# Patient Record
Sex: Female | Born: 1980 | Race: White | Hispanic: No | Marital: Married | Smoking: Never smoker
Health system: Southern US, Community
[De-identification: ages and names within clinical notes are randomized; demographics above are authoritative.]

## PROBLEM LIST (undated history)

## (undated) ENCOUNTER — Inpatient Hospital Stay (HOSPITAL_COMMUNITY): Payer: Self-pay

## (undated) DIAGNOSIS — T7840XA Allergy, unspecified, initial encounter: Secondary | ICD-10-CM

## (undated) DIAGNOSIS — G43909 Migraine, unspecified, not intractable, without status migrainosus: Secondary | ICD-10-CM

## (undated) DIAGNOSIS — F419 Anxiety disorder, unspecified: Secondary | ICD-10-CM

## (undated) DIAGNOSIS — G5601 Carpal tunnel syndrome, right upper limb: Secondary | ICD-10-CM

## (undated) DIAGNOSIS — B019 Varicella without complication: Secondary | ICD-10-CM

## (undated) DIAGNOSIS — F32A Depression, unspecified: Secondary | ICD-10-CM

## (undated) DIAGNOSIS — K900 Celiac disease: Secondary | ICD-10-CM

## (undated) DIAGNOSIS — F988 Other specified behavioral and emotional disorders with onset usually occurring in childhood and adolescence: Secondary | ICD-10-CM

## (undated) DIAGNOSIS — K219 Gastro-esophageal reflux disease without esophagitis: Secondary | ICD-10-CM

## (undated) DIAGNOSIS — F329 Major depressive disorder, single episode, unspecified: Secondary | ICD-10-CM

## (undated) DIAGNOSIS — L680 Hirsutism: Secondary | ICD-10-CM

## (undated) DIAGNOSIS — D649 Anemia, unspecified: Secondary | ICD-10-CM

## (undated) HISTORY — PX: UPPER GASTROINTESTINAL ENDOSCOPY: SHX188

## (undated) HISTORY — DX: Hirsutism: L68.0

## (undated) HISTORY — DX: Other specified behavioral and emotional disorders with onset usually occurring in childhood and adolescence: F98.8

## (undated) HISTORY — DX: Anxiety disorder, unspecified: F41.9

## (undated) HISTORY — DX: Allergy, unspecified, initial encounter: T78.40XA

## (undated) HISTORY — DX: Gastro-esophageal reflux disease without esophagitis: K21.9

## (undated) HISTORY — PX: TONSILLECTOMY: SUR1361

## (undated) HISTORY — PX: COLONOSCOPY: SHX174

## (undated) HISTORY — DX: Depression, unspecified: F32.A

## (undated) HISTORY — DX: Anemia, unspecified: D64.9

## (undated) HISTORY — DX: Migraine, unspecified, not intractable, without status migrainosus: G43.909

## (undated) HISTORY — DX: Celiac disease: K90.0

## (undated) HISTORY — DX: Varicella without complication: B01.9

## (undated) HISTORY — DX: Carpal tunnel syndrome, right upper limb: G56.01

## (undated) HISTORY — DX: Major depressive disorder, single episode, unspecified: F32.9

---

## 2004-03-14 HISTORY — PX: LASIK: SHX215

## 2007-07-26 LAB — HM COLONOSCOPY

## 2015-12-31 ENCOUNTER — Telehealth: Payer: Self-pay | Admitting: *Deleted

## 2015-12-31 ENCOUNTER — Encounter: Payer: Self-pay | Admitting: *Deleted

## 2015-12-31 NOTE — Telephone Encounter (Signed)
Pre-Visit Call completed with patient and chart updated.   Pre-Visit Info documented in Specialty Comments under Snap[employer].    

## 2016-01-01 ENCOUNTER — Ambulatory Visit (INDEPENDENT_AMBULATORY_CARE_PROVIDER_SITE_OTHER): Payer: Self-pay | Admitting: Family

## 2016-01-01 ENCOUNTER — Encounter: Payer: Self-pay | Admitting: Family

## 2016-01-01 DIAGNOSIS — F32A Depression, unspecified: Secondary | ICD-10-CM | POA: Insufficient documentation

## 2016-01-01 DIAGNOSIS — Z23 Encounter for immunization: Secondary | ICD-10-CM

## 2016-01-01 DIAGNOSIS — K900 Celiac disease: Secondary | ICD-10-CM

## 2016-01-01 DIAGNOSIS — F329 Major depressive disorder, single episode, unspecified: Secondary | ICD-10-CM

## 2016-01-01 DIAGNOSIS — Z9109 Other allergy status, other than to drugs and biological substances: Secondary | ICD-10-CM | POA: Insufficient documentation

## 2016-01-01 NOTE — Patient Instructions (Signed)
Welcome to [facility]! 

## 2016-01-01 NOTE — Assessment & Plan Note (Signed)
Stable, monitor.  °
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
## 2016-01-01 NOTE — Progress Notes (Signed)
Pre visit review using our clinic review tool, if applicable. No additional management support is needed unless otherwise documented below in the visit note. 

## 2016-01-01 NOTE — Assessment & Plan Note (Signed)
Clinically stable on a gluten free diet.

## 2016-01-01 NOTE — Assessment & Plan Note (Addendum)
Stable on fluoxetine, continue same. She would like a referral to a therapist and I gave her the [facility] pamphlet

## 2016-01-13 ENCOUNTER — Encounter: Payer: Self-pay | Admitting: Family

## 2016-01-13 DIAGNOSIS — F419 Anxiety disorder, unspecified: Secondary | ICD-10-CM | POA: Insufficient documentation

## 2016-01-13 DIAGNOSIS — G43909 Migraine, unspecified, not intractable, without status migrainosus: Secondary | ICD-10-CM | POA: Insufficient documentation

## 2016-01-13 DIAGNOSIS — L680 Hirsutism: Secondary | ICD-10-CM | POA: Insufficient documentation

## 2016-01-13 DIAGNOSIS — F988 Other specified behavioral and emotional disorders with onset usually occurring in childhood and adolescence: Secondary | ICD-10-CM | POA: Insufficient documentation

## 2016-03-11 ENCOUNTER — Other Ambulatory Visit: Payer: Self-pay | Admitting: Family

## 2016-03-11 ENCOUNTER — Encounter: Payer: Self-pay | Admitting: Family

## 2016-03-11 ENCOUNTER — Other Ambulatory Visit: Payer: Self-pay

## 2016-03-11 MED ORDER — FLUOXETINE HCL 40 MG PO CAPS: 40.0000 mg | ORAL_CAPSULE | Freq: Every day | ORAL | 0 refills | Status: DC

## 2016-03-11 NOTE — Telephone Encounter (Signed)
Patient is requesting a refill of FLUoxetine (PROZAC) 40 MG capsule Please advise   [facility] [place] - [id number]

## 2016-03-11 NOTE — Telephone Encounter (Signed)
Refill done already through a mychart msg.from other assistant.

## 2016-04-27 LAB — OB RESULTS CONSOLE GBS: GBS: NEGATIVE

## 2016-08-06 ENCOUNTER — Other Ambulatory Visit: Payer: Self-pay | Admitting: Family

## 2016-10-22 ENCOUNTER — Inpatient Hospital Stay (HOSPITAL_COMMUNITY): Payer: 59

## 2016-10-22 ENCOUNTER — Encounter (HOSPITAL_COMMUNITY): Payer: Self-pay | Admitting: *Deleted

## 2016-10-22 ENCOUNTER — Inpatient Hospital Stay (HOSPITAL_COMMUNITY)
Admission: AD | Admit: 2016-10-22 | Discharge: 2016-10-22 | Disposition: A | Discharge status: Home / Self Care | Payer: 59 | Source: Ambulatory Visit | Attending: Obstetrics | Admitting: Obstetrics

## 2016-10-22 DIAGNOSIS — Z882 Allergy status to sulfonamides status: Secondary | ICD-10-CM | POA: Diagnosis not present

## 2016-10-22 DIAGNOSIS — Z91018 Allergy to other foods: Secondary | ICD-10-CM | POA: Insufficient documentation

## 2016-10-22 DIAGNOSIS — Z6791 Unspecified blood type, Rh negative: Secondary | ICD-10-CM

## 2016-10-22 DIAGNOSIS — O4691 Antepartum hemorrhage, unspecified, first trimester: Secondary | ICD-10-CM | POA: Diagnosis not present

## 2016-10-22 DIAGNOSIS — O09891 Supervision of other high risk pregnancies, first trimester: Secondary | ICD-10-CM | POA: Diagnosis not present

## 2016-10-22 DIAGNOSIS — O209 Hemorrhage in early pregnancy, unspecified: Secondary | ICD-10-CM | POA: Diagnosis not present

## 2016-10-22 DIAGNOSIS — O4690 Antepartum hemorrhage, unspecified, unspecified trimester: Secondary | ICD-10-CM

## 2016-10-22 DIAGNOSIS — Z3A09 9 weeks gestation of pregnancy: Secondary | ICD-10-CM | POA: Insufficient documentation

## 2016-10-22 DIAGNOSIS — Z91012 Allergy to eggs: Secondary | ICD-10-CM | POA: Insufficient documentation

## 2016-10-22 DIAGNOSIS — O26899 Other specified pregnancy related conditions, unspecified trimester: Secondary | ICD-10-CM

## 2016-10-22 DIAGNOSIS — N939 Abnormal uterine and vaginal bleeding, unspecified: Secondary | ICD-10-CM | POA: Diagnosis present

## 2016-10-22 LAB — ABO/RH: ABO/RH(D): AB NEG

## 2016-10-22 MED ORDER — RHO D IMMUNE GLOBULIN 1500 UNIT/2ML IJ SOSY
300.0000 ug | PREFILLED_SYRINGE | Freq: Once | INTRAMUSCULAR | Status: AC
  Administered 2016-10-22: 300 ug via INTRAMUSCULAR
  Filled 2016-10-22: qty 2

## 2016-10-22 NOTE — MAU Note (Signed)
Don't have any other symptoms, but passed a really large clot about an hour ago. No cramping or bleeding. Is concerned it is a miscarriage.  Should be 9.5 wks preg. Had an US in office around 7 wks

## 2016-10-22 NOTE — Discharge Instructions (Signed)
Vaginal Bleeding During Pregnancy, First Trimester °A small amount of bleeding (spotting) from the vagina is common in early pregnancy. Sometimes the bleeding is normal and is not a problem, and sometimes it is a sign of something serious. Be sure to tell your doctor about any bleeding from your vagina right away. °Follow these instructions at home: °· Watch your condition for any changes. °· Follow your doctor's instructions about how active you can be. °· If you are on bed rest: °? You may need to stay in bed and only get up to use the bathroom. °? You may be allowed to do some activities. °? If you need help, make plans for someone to help you. °· Write down: °? The number of pads you use each day. °? How often you change pads. °? How soaked (saturated) your pads are. °· Do not use tampons. °· Do not douche. °· Do not have sex or orgasms until your doctor says it is okay. °· If you pass any tissue from your vagina, save the tissue so you can show it to your doctor. °· Only take medicines as told by your doctor. °· Do not take aspirin because it can make you bleed. °· Keep all follow-up visits as told by your doctor. °Contact a doctor if: °· You bleed from your vagina. °· You have cramps. °· You have labor pains. °· You have a fever that does not go away after you take medicine. °Get help right away if: °· You have very bad cramps in your back or belly (abdomen). °· You pass large clots or tissue from your vagina. °· You bleed more. °· You feel light-headed or weak. °· You pass out (faint). °· You have chills. °· You are leaking fluid or have a gush of fluid from your vagina. °· You pass out while pooping (having a bowel movement). °This information is not intended to replace advice given to you by your health care provider. Make sure you discuss any questions you have with your health care provider. °Document Released: 07/15/2013 Document Revised: 08/06/2015 Document Reviewed: 11/05/2012 °Elsevier Interactive  Patient Education © 2018 Elsevier Inc. ° °

## 2016-10-22 NOTE — MAU Provider Note (Signed)
History     CSN: [id number]  Arrival date and time: 10/22/16 1401   First Provider Initiated Contact with Patient 10/22/16 1440      Chief Complaint  Patient presents with  . passed a clot   [id number] @[redacted]w[redacted]d  here after passing a blood clot. She had IC this am then passed a large clot around noon. No cramping or pain. Scant bleeding since.    OB History    Gravida Para Term Preterm AB Living   3 1 1   1 1    SAB TAB Ectopic Multiple Live Births   1              Past Medical History:  Diagnosis Date  . ADD (attention deficit disorder)   . Allergy   . Anxiety   . Celiac disease   . Chicken pox   . Depression   . Hirsutism   . Migraine     Past Surgical History:  Procedure Laterality Date  . LASIK  2006  . TONSILLECTOMY    . VAGINAL DELIVERY      Family History  Problem Relation Age of Onset  . Breast cancer Paternal Grandmother     Social History  Substance Use Topics  . Smoking status: Never Smoker  . Smokeless tobacco: Never Used  . Alcohol use Yes    Allergies:  Allergies  Allergen Reactions  . Chicken Allergy Other (See Comments)    GI intolerance  . Eggs Or Egg-Derived Products Other (See Comments)    GI intolerance  . Gluten Meal Other (See Comments)    GI intolerance  . Sulfa Antibiotics Nausea And Vomiting, Other (See Comments) and Hives    Dizziness    Prescriptions Prior to Admission  Medication Sig Dispense Refill Last Dose  . FLUoxetine (PROZAC) 40 MG capsule TAKE ONE CAPSULE BY MOUTH DAILY 30 capsule 1     Review of Systems  Constitutional: Negative for fever.  Gastrointestinal: Negative for abdominal pain.  Genitourinary: Positive for vaginal bleeding. Negative for pelvic pain.   Physical Exam   Blood pressure 106/82, pulse (!) 57, temperature 98.3 F (36.8 C), temperature source Oral, resp. rate 18, weight 151 lb 4 oz (68.6 kg), last menstrual period 08/16/2016, SpO2 100 %.  Physical Exam  Constitutional: She is oriented to  person, place, and time. She appears well-developed and well-nourished. No distress.  HENT:  Head: Normocephalic and atraumatic.  Neck: Normal range of motion.  Respiratory: Effort normal. No respiratory distress.  Genitourinary:  Genitourinary Comments: External: no lesions or erythema Vagina: rugated, pink, moist, scant pink mucous discharge from os Cervix closed/long  *examined specimen pt brought with her: appears to be nickel sized blood clot, no tissue visualized  Musculoskeletal: Normal range of motion.  Neurological: She is alert and oriented to person, place, and time.  Skin: Skin is warm and dry.  Psychiatric: She has a normal mood and affect.   Results for orders placed or performed during the hospital encounter of 10/22/16 (from the past 24 hour(s))  Rh IG workup (includes ABO/Rh)     Status: None (Preliminary result)   Collection Time: 10/22/16  2:50 PM  Result Value Ref Range   Gestational Age(Wks) 9    ABO/RH(D) AB NEG    Antibody Screen NEG    Unit Number [id number]    Blood Component Type RHIG    Unit division 00    Status of Unit ISSUED    Transfusion Status OK TO TRANSFUSE  US Ob Comp Less 14 Wks  Result Date: 10/22/2016 CLINICAL DATA:  Initial evaluation for acute vaginal bleeding in early pregnancy. EXAM: OBSTETRIC <14 WK ULTRASOUND TECHNIQUE: Transabdominal ultrasound was performed for evaluation of the gestation as well as the maternal uterus and adnexal regions. COMPARISON:  None. FINDINGS: Intrauterine gestational sac: Single Yolk sac:  Present Embryo:  Present Cardiac Activity: Present Heart Rate: 183 bpm CRL:   25.0  mm   9 w 1 d                  [place] EDC: 05/26/2017 Subchorionic hemorrhage:  None visualized. Maternal uterus/adnexae: Maternal uterus within normal limits. Ovaries well visualized and are normal and appearance. No adnexal mass. No free fluid within the pelvis. IMPRESSION: Single viable intrauterine pregnancy as above without complication.  Estimated gestational age [redacted] weeks and 1 day by sonography. Electronically Signed   By: Rise Mu M.D.   On: 10/22/2016 15:46   US Ob Transvaginal  Result Date: 10/22/2016 CLINICAL DATA:  Initial evaluation for acute vaginal bleeding in early pregnancy. EXAM: OBSTETRIC <14 WK ULTRASOUND TECHNIQUE: Transabdominal ultrasound was performed for evaluation of the gestation as well as the maternal uterus and adnexal regions. COMPARISON:  None. FINDINGS: Intrauterine gestational sac: Single Yolk sac:  Present Embryo:  Present Cardiac Activity: Present Heart Rate: 183 bpm CRL:   25.0  mm   9 w 1 d                  [place] EDC: 05/26/2017 Subchorionic hemorrhage:  None visualized. Maternal uterus/adnexae: Maternal uterus within normal limits. Ovaries well visualized and are normal and appearance. No adnexal mass. No free fluid within the pelvis. IMPRESSION: Single viable intrauterine pregnancy as above without complication. Estimated gestational age [redacted] weeks and 1 day by sonography. Electronically Signed   By: Rise Mu M.D.   On: 10/22/2016 15:46   MAU Course  Procedures Rhogam  MDM Labs and [place] ordered and reviewed. Bleeding likely caused by IC. Presentation, clinical findings, and plan discussed with Dr. Chestine Spore. Stable for discharge home.  Assessment and Plan   1. [redacted] weeks gestation of pregnancy   2. Vaginal bleeding in pregnancy   3. Rh negative state in antepartum period    Discharge home Follow up in OB office in 4 days as scheduled Bleeding/SAB precautions  Allergies as of 10/22/2016      Reactions   Chicken Allergy Other (See Comments)   GI intolerance   Eggs Or Egg-derived Products Other (See Comments)   GI intolerance   Gluten Meal Other (See Comments)   GI intolerance   Sulfa Antibiotics Nausea And Vomiting, Other (See Comments), Hives   Dizziness      Medication List    TAKE these medications   FLUoxetine 40 MG capsule Commonly known as:  PROZAC TAKE ONE CAPSULE  BY MOUTH DAILY      Donette Larry, CNM 10/22/2016, 2:51 PM

## 2016-10-23 LAB — RH IG WORKUP (INCLUDES ABO/RH)
ABO/RH(D): AB NEG
Antibody Screen: NEGATIVE
Gestational Age(Wks): 9
UNIT DIVISION: 0

## 2016-10-26 LAB — OB RESULTS CONSOLE ANTIBODY SCREEN: Antibody Screen: POSITIVE

## 2016-10-26 LAB — OB RESULTS CONSOLE HEPATITIS B SURFACE ANTIGEN: HEP B S AG: NEGATIVE

## 2016-10-26 LAB — OB RESULTS CONSOLE GC/CHLAMYDIA
Chlamydia: NEGATIVE
Gonorrhea: NEGATIVE

## 2016-10-26 LAB — OB RESULTS CONSOLE RPR: RPR: NONREACTIVE

## 2016-10-26 LAB — OB RESULTS CONSOLE ABO/RH: RH TYPE: NEGATIVE

## 2016-10-26 LAB — OB RESULTS CONSOLE RUBELLA ANTIBODY, IGM: RUBELLA: IMMUNE

## 2016-10-26 LAB — OB RESULTS CONSOLE HIV ANTIBODY (ROUTINE TESTING): HIV: NONREACTIVE

## 2017-03-14 NOTE — L&D Delivery Note (Signed)
Delivery Note Patient progressed rapidly to 10 cm.  She pushed well for 20 minutes. At 10:59 AM a viable female was delivered via Vaginal, Spontaneous (Presentation: direct OA ).  APGAR: 9, 9; weight pending.   Placenta status: Spontaneous, in tact .  Cord: 3V with the following complications:nuchal cord x 1 not reduced prior to delivery .  Cord pH: n/a  Anesthesia:  Epidural Episiotomy: None Lacerations: None Suture Repair: n/a Est. Blood Loss (mL): 300  Mom to postpartum.  Baby to Couplet care / Skin to Skin.  Effrey Davidow GEFFEL Consuelo Thayne 05/18/2017, 11:14 AM

## 2017-03-24 ENCOUNTER — Telehealth: Payer: Self-pay | Admitting: Family

## 2017-03-24 NOTE — Telephone Encounter (Signed)
Pt called in because she received a bill for DOS 01/01/16.  Pt says that at that visit she was a new pt. Pt says that she received a collection letter in regards to visit. Pt says that she provided insurance but some how insurance was not billed. (the same on file) Pt would like to know if she can receive further assistance with concern. I did advise pt to contact billing, pt says that she did that previously when she received the original bill and thought that it was corrected.   [phone] - if possible, please update pt.

## 2017-04-27 LAB — OB RESULTS CONSOLE GBS: GBS: NEGATIVE

## 2017-05-04 ENCOUNTER — Telehealth (HOSPITAL_COMMUNITY): Payer: Self-pay | Admitting: *Deleted

## 2017-05-09 ENCOUNTER — Encounter (HOSPITAL_COMMUNITY): Payer: Self-pay | Admitting: *Deleted

## 2017-05-09 NOTE — Telephone Encounter (Signed)
Preadmission screen  

## 2017-05-12 ENCOUNTER — Inpatient Hospital Stay (HOSPITAL_COMMUNITY)
Admission: AD | Admit: 2017-05-12 | Discharge: 2017-05-13 | Disposition: A | Discharge status: Home / Self Care | Payer: 59 | Source: Ambulatory Visit | Attending: Obstetrics | Admitting: Obstetrics

## 2017-05-12 ENCOUNTER — Other Ambulatory Visit: Payer: Self-pay

## 2017-05-12 ENCOUNTER — Inpatient Hospital Stay (HOSPITAL_COMMUNITY)
Admission: AD | Admit: 2017-05-12 | Discharge: 2017-05-12 | Disposition: A | Discharge status: Home / Self Care | Payer: 59 | Source: Ambulatory Visit | Attending: Obstetrics | Admitting: Obstetrics

## 2017-05-12 ENCOUNTER — Encounter (HOSPITAL_COMMUNITY): Payer: Self-pay | Admitting: *Deleted

## 2017-05-12 DIAGNOSIS — O471 False labor at or after 37 completed weeks of gestation: Secondary | ICD-10-CM

## 2017-05-12 DIAGNOSIS — O26893 Other specified pregnancy related conditions, third trimester: Secondary | ICD-10-CM | POA: Diagnosis present

## 2017-05-12 DIAGNOSIS — Z3A39 39 weeks gestation of pregnancy: Secondary | ICD-10-CM | POA: Insufficient documentation

## 2017-05-12 NOTE — MAU Note (Signed)
Pt presents with c/o ctxs every 3-5 minutes.  Reports seen in office today and cervix was 4cms.  Denies VB or LOF.  Reports FM.

## 2017-05-12 NOTE — MAU Note (Signed)
CTX have gotten stronger and closer together.  No LOF/VB.  Reports good FM.  4 cm earlier tonight.

## 2017-05-12 NOTE — Discharge Instructions (Signed)
Braxton Hicks Contractions °Contractions of the uterus can occur throughout pregnancy, but they are not always a sign that you are in labor. You may have practice contractions called Braxton Hicks contractions. These false labor contractions are sometimes confused with true labor. °What are Braxton Hicks contractions? °Braxton Hicks contractions are tightening movements that occur in the muscles of the uterus before labor. Unlike true labor contractions, these contractions do not result in opening (dilation) and thinning of the cervix. Toward the end of pregnancy (32-34 weeks), Braxton Hicks contractions can happen more often and may become stronger. These contractions are sometimes difficult to tell apart from true labor because they can be very uncomfortable. You should not feel embarrassed if you go to the hospital with false labor. °Sometimes, the only way to tell if you are in true labor is for your health care provider to look for changes in the cervix. The health care provider will do a physical exam and may monitor your contractions. If you are not in true labor, the exam should show that your cervix is not dilating and your water has not broken. °If there are other health problems associated with your pregnancy, it is completely safe for you to be sent home with false labor. You may continue to have Braxton Hicks contractions until you go into true labor. °How to tell the difference between true labor and false labor °True labor °· Contractions last 30-70 seconds. °· Contractions become very regular. °· Discomfort is usually felt in the top of the uterus, and it spreads to the lower abdomen and low back. °· Contractions do not go away with walking. °· Contractions usually become more intense and increase in frequency. °· The cervix dilates and gets thinner. °False labor °· Contractions are usually shorter and not as strong as true labor contractions. °· Contractions are usually irregular. °· Contractions  are often felt in the front of the lower abdomen and in the groin. °· Contractions may go away when you walk around or change positions while lying down. °· Contractions get weaker and are shorter-lasting as time goes on. °· The cervix usually does not dilate or become thin. °Follow these instructions at home: °· Take over-the-counter and prescription medicines only as told by your health care provider. °· Keep up with your usual exercises and follow other instructions from your health care provider. °· Eat and drink lightly if you think you are going into labor. °· If Braxton Hicks contractions are making you uncomfortable: °? Change your position from lying down or resting to walking, or change from walking to resting. °? Sit and rest in a tub of warm water. °? Drink enough fluid to keep your urine pale yellow. Dehydration may cause these contractions. °? Do slow and deep breathing several times an hour. °· Keep all follow-up prenatal visits as told by your health care provider. This is important. °Contact a health care provider if: °· You have a fever. °· You have continuous pain in your abdomen. °Get help right away if: °· Your contractions become stronger, more regular, and closer together. °· You have fluid leaking or gushing from your vagina. °· You pass blood-tinged mucus (bloody show). °· You have bleeding from your vagina. °· You have low back pain that you never had before. °· You feel your baby’s head pushing down and causing pelvic pressure. °· Your baby is not moving inside you as much as it used to. °Summary °· Contractions that occur before labor are called Braxton   Hicks contractions, false labor, or practice contractions. °· Braxton Hicks contractions are usually shorter, weaker, farther apart, and less regular than true labor contractions. True labor contractions usually become progressively stronger and regular and they become more frequent. °· Manage discomfort from Braxton Hicks contractions by  changing position, resting in a warm bath, drinking plenty of water, or practicing deep breathing. °This information is not intended to replace advice given to you by your health care provider. Make sure you discuss any questions you have with your health care provider. °Document Released: 07/14/2016 Document Revised: 07/14/2016 Document Reviewed: 07/14/2016 °Elsevier Interactive Patient Education © 2018 Elsevier Inc. ° °Fetal Movement Counts °Patient Name: ________________________________________________ Patient Due Date: ____________________ °What is a fetal movement count? °A fetal movement count is the number of times that you feel your baby move during a certain amount of time. This may also be called a fetal kick count. A fetal movement count is recommended for every pregnant woman. You may be asked to start counting fetal movements as early as week 28 of your pregnancy. °Pay attention to when your baby is most active. You may notice your baby's sleep and wake cycles. You may also notice things that make your baby move more. You should do a fetal movement count: °· When your baby is normally most active. °· At the same time each day. ° °A good time to count movements is while you are resting, after having something to eat and drink. °How do I count fetal movements? °1. Find a quiet, comfortable area. Sit, or lie down on your side. °2. Write down the date, the start time and stop time, and the number of movements that you felt between those two times. Take this information with you to your health care visits. °3. For 2 hours, count kicks, flutters, swishes, rolls, and jabs. You should feel at least 10 movements during 2 hours. °4. You may stop counting after you have felt 10 movements. °5. If you do not feel 10 movements in 2 hours, have something to eat and drink. Then, keep resting and counting for 1 hour. If you feel at least 4 movements during that hour, you may stop counting. °Contact a health care  provider if: °· You feel fewer than 4 movements in 2 hours. °· Your baby is not moving like he or she usually does. °Date: ____________ Start time: ____________ Stop time: ____________ Movements: ____________ °Date: ____________ Start time: ____________ Stop time: ____________ Movements: ____________ °Date: ____________ Start time: ____________ Stop time: ____________ Movements: ____________ °Date: ____________ Start time: ____________ Stop time: ____________ Movements: ____________ °Date: ____________ Start time: ____________ Stop time: ____________ Movements: ____________ °Date: ____________ Start time: ____________ Stop time: ____________ Movements: ____________ °Date: ____________ Start time: ____________ Stop time: ____________ Movements: ____________ °Date: ____________ Start time: ____________ Stop time: ____________ Movements: ____________ °Date: ____________ Start time: ____________ Stop time: ____________ Movements: ____________ °This information is not intended to replace advice given to you by your health care provider. Make sure you discuss any questions you have with your health care provider. °Document Released: 03/30/2006 Document Revised: 10/28/2015 Document Reviewed: 04/09/2015 °Elsevier Interactive Patient Education © 2018 Elsevier Inc. ° °

## 2017-05-13 DIAGNOSIS — O26893 Other specified pregnancy related conditions, third trimester: Secondary | ICD-10-CM | POA: Diagnosis not present

## 2017-05-13 MED ORDER — ONDANSETRON HCL 8 MG PO TABS: 8.0000 mg | ORAL_TABLET | Freq: Three times a day (TID) | ORAL | 0 refills | Status: DC | PRN

## 2017-05-13 MED ORDER — ONDANSETRON HCL 4 MG PO TABS
8.0000 mg | ORAL_TABLET | Freq: Once | ORAL | Status: AC
  Administered 2017-05-13: 8 mg via ORAL
  Filled 2017-05-13: qty 2

## 2017-05-13 NOTE — MAU Note (Signed)
I have communicated with Dr. Chestine Spore and reviewed vital signs:  Vitals:   05/12/17 2323  BP: 99/60  Pulse: 94  Temp: 98.3 F (36.8 C)    Vaginal exam:  Dilation: 4.5 Effacement (%): 80 Station: -2 Presentation: Vertex Exam by:: A Aiyanah Kalama, RN,   Also reviewed contraction pattern and that non-stress test is reactive.  It has been documented that patient is contracting every 3-5 minutes with minimal cervical change over 2.5 hours not indicating active labor.  Patient denies any other complaints.  Based on this report provider has given order for discharge.  A discharge order and diagnosis entered by a provider.   Labor discharge instructions reviewed with patient.

## 2017-05-18 ENCOUNTER — Inpatient Hospital Stay (HOSPITAL_COMMUNITY): Payer: 59 | Admitting: Anesthesiology

## 2017-05-18 ENCOUNTER — Encounter (HOSPITAL_COMMUNITY): Payer: Self-pay

## 2017-05-18 ENCOUNTER — Inpatient Hospital Stay (HOSPITAL_COMMUNITY)
Admission: RE | Admit: 2017-05-18 | Discharge: 2017-05-19 | DRG: 807 | Disposition: A | Discharge status: Home / Self Care | Payer: 59 | Source: Ambulatory Visit | Attending: Obstetrics | Admitting: Obstetrics

## 2017-05-18 DIAGNOSIS — O9902 Anemia complicating childbirth: Secondary | ICD-10-CM | POA: Diagnosis present

## 2017-05-18 DIAGNOSIS — D649 Anemia, unspecified: Secondary | ICD-10-CM | POA: Diagnosis present

## 2017-05-18 DIAGNOSIS — O99344 Other mental disorders complicating childbirth: Secondary | ICD-10-CM | POA: Diagnosis present

## 2017-05-18 DIAGNOSIS — F329 Major depressive disorder, single episode, unspecified: Secondary | ICD-10-CM | POA: Diagnosis present

## 2017-05-18 DIAGNOSIS — Z3A39 39 weeks gestation of pregnancy: Secondary | ICD-10-CM

## 2017-05-18 DIAGNOSIS — O26893 Other specified pregnancy related conditions, third trimester: Secondary | ICD-10-CM | POA: Diagnosis present

## 2017-05-18 DIAGNOSIS — O09523 Supervision of elderly multigravida, third trimester: Secondary | ICD-10-CM | POA: Diagnosis present

## 2017-05-18 LAB — CBC
HEMATOCRIT: 34.9 % — AB (ref 36.0–46.0)
Hemoglobin: 11.4 g/dL — ABNORMAL LOW (ref 12.0–15.0)
MCH: 24.5 pg — ABNORMAL LOW (ref 26.0–34.0)
MCHC: 32.7 g/dL (ref 30.0–36.0)
MCV: 74.9 fL — ABNORMAL LOW (ref 78.0–100.0)
PLATELETS: 339 10*3/uL (ref 150–400)
RBC: 4.66 MIL/uL (ref 3.87–5.11)
RDW: 15.7 % — AB (ref 11.5–15.5)
WBC: 8.2 10*3/uL (ref 4.0–10.5)

## 2017-05-18 LAB — RPR: RPR: NONREACTIVE

## 2017-05-18 MED ORDER — LACTATED RINGERS IV SOLN
INTRAVENOUS | Status: DC
  Administered 2017-05-18: 125 mL/h via INTRAVENOUS

## 2017-05-18 MED ORDER — ACETAMINOPHEN 325 MG PO TABS: 650.0000 mg | ORAL_TABLET | ORAL | Status: DC | PRN

## 2017-05-18 MED ORDER — FENTANYL CITRATE (PF) 100 MCG/2ML IJ SOLN: 50.0000 ug | INTRAMUSCULAR | Status: DC | PRN

## 2017-05-18 MED ORDER — EPHEDRINE 5 MG/ML INJ
10.0000 mg | INTRAVENOUS | Status: DC | PRN
  Filled 2017-05-18: qty 2

## 2017-05-18 MED ORDER — OXYCODONE-ACETAMINOPHEN 5-325 MG PO TABS: 2.0000 | ORAL_TABLET | ORAL | Status: DC | PRN

## 2017-05-18 MED ORDER — TERBUTALINE SULFATE 1 MG/ML IJ SOLN
0.2500 mg | Freq: Once | INTRAMUSCULAR | Status: DC | PRN
  Filled 2017-05-18: qty 1

## 2017-05-18 MED ORDER — DIPHENHYDRAMINE HCL 50 MG/ML IJ SOLN: 12.5000 mg | INTRAMUSCULAR | Status: DC | PRN

## 2017-05-18 MED ORDER — COCONUT OIL OIL: 1.0000 "application " | TOPICAL_OIL | Status: DC | PRN

## 2017-05-18 MED ORDER — DIBUCAINE 1 % RE OINT: 1.0000 "application " | TOPICAL_OINTMENT | RECTAL | Status: DC | PRN

## 2017-05-18 MED ORDER — DIPHENHYDRAMINE HCL 25 MG PO CAPS
25.0000 mg | ORAL_CAPSULE | Freq: Four times a day (QID) | ORAL | Status: DC | PRN
  Administered 2017-05-18 – 2017-05-19 (×3): 25 mg via ORAL
  Filled 2017-05-18 (×3): qty 1

## 2017-05-18 MED ORDER — LIDOCAINE HCL (PF) 1 % IJ SOLN
30.0000 mL | INTRAMUSCULAR | Status: DC | PRN
  Filled 2017-05-18: qty 30

## 2017-05-18 MED ORDER — SOD CITRATE-CITRIC ACID 500-334 MG/5ML PO SOLN
30.0000 mL | ORAL | Status: DC | PRN
Start: 2017-05-18 — End: 2017-05-18

## 2017-05-18 MED ORDER — SENNOSIDES-DOCUSATE SODIUM 8.6-50 MG PO TABS
2.0000 | ORAL_TABLET | ORAL | Status: DC
  Administered 2017-05-18: 2 via ORAL
  Filled 2017-05-18: qty 2

## 2017-05-18 MED ORDER — FLUOXETINE HCL 20 MG PO CAPS
40.0000 mg | ORAL_CAPSULE | Freq: Every day | ORAL | Status: DC
  Administered 2017-05-18 – 2017-05-19 (×2): 40 mg via ORAL
  Filled 2017-05-18 (×3): qty 2

## 2017-05-18 MED ORDER — ONDANSETRON HCL 4 MG/2ML IJ SOLN: 4.0000 mg | INTRAMUSCULAR | Status: DC | PRN

## 2017-05-18 MED ORDER — OXYTOCIN 40 UNITS IN LACTATED RINGERS INFUSION - SIMPLE MED: 1.0000 m[IU]/min | INTRAVENOUS | Status: DC

## 2017-05-18 MED ORDER — ONDANSETRON HCL 4 MG/2ML IJ SOLN
4.0000 mg | Freq: Four times a day (QID) | INTRAMUSCULAR | Status: DC | PRN
Start: 2017-05-18 — End: 2017-05-18

## 2017-05-18 MED ORDER — ONDANSETRON HCL 4 MG PO TABS: 4.0000 mg | ORAL_TABLET | ORAL | Status: DC | PRN

## 2017-05-18 MED ORDER — LACTATED RINGERS IV SOLN: 500.0000 mL | Freq: Once | INTRAVENOUS | Status: DC

## 2017-05-18 MED ORDER — TETANUS-DIPHTH-ACELL PERTUSSIS 5-2.5-18.5 LF-MCG/0.5 IM SUSP: 0.5000 mL | Freq: Once | INTRAMUSCULAR | Status: DC

## 2017-05-18 MED ORDER — LACTATED RINGERS IV SOLN
500.0000 mL | INTRAVENOUS | Status: DC | PRN
  Administered 2017-05-18: 500 mL via INTRAVENOUS

## 2017-05-18 MED ORDER — OXYTOCIN 40 UNITS IN LACTATED RINGERS INFUSION - SIMPLE MED: 2.5000 [IU]/h | INTRAVENOUS | Status: DC

## 2017-05-18 MED ORDER — PHENYLEPHRINE 40 MCG/ML (10ML) SYRINGE FOR IV PUSH (FOR BLOOD PRESSURE SUPPORT)
80.0000 ug | PREFILLED_SYRINGE | INTRAVENOUS | Status: DC | PRN
  Filled 2017-05-18: qty 10
  Filled 2017-05-18: qty 5

## 2017-05-18 MED ORDER — LIDOCAINE HCL (PF) 1 % IJ SOLN
INTRAMUSCULAR | Status: DC | PRN
  Administered 2017-05-18: 4 mL via EPIDURAL
  Administered 2017-05-18: 8 mL via EPIDURAL

## 2017-05-18 MED ORDER — OXYTOCIN 40 UNITS IN LACTATED RINGERS INFUSION - SIMPLE MED
1.0000 m[IU]/min | INTRAVENOUS | Status: DC
  Administered 2017-05-18: 2 m[IU]/min via INTRAVENOUS
  Filled 2017-05-18: qty 1000

## 2017-05-18 MED ORDER — SIMETHICONE 80 MG PO CHEW: 80.0000 mg | CHEWABLE_TABLET | ORAL | Status: DC | PRN

## 2017-05-18 MED ORDER — WITCH HAZEL-GLYCERIN EX PADS: 1.0000 "application " | MEDICATED_PAD | CUTANEOUS | Status: DC | PRN

## 2017-05-18 MED ORDER — PRENATAL MULTIVITAMIN CH
1.0000 | ORAL_TABLET | Freq: Every day | ORAL | Status: DC
  Administered 2017-05-19: 1 via ORAL
  Filled 2017-05-18: qty 1

## 2017-05-18 MED ORDER — BENZOCAINE-MENTHOL 20-0.5 % EX AERO: 1.0000 "application " | INHALATION_SPRAY | CUTANEOUS | Status: DC | PRN

## 2017-05-18 MED ORDER — OXYCODONE-ACETAMINOPHEN 5-325 MG PO TABS: 1.0000 | ORAL_TABLET | ORAL | Status: DC | PRN

## 2017-05-18 MED ORDER — FENTANYL 2.5 MCG/ML BUPIVACAINE 1/10 % EPIDURAL INFUSION (WH - ANES)
14.0000 mL/h | INTRAMUSCULAR | Status: DC | PRN
  Administered 2017-05-18: 14 mL/h via EPIDURAL
  Filled 2017-05-18: qty 100

## 2017-05-18 MED ORDER — PHENYLEPHRINE 40 MCG/ML (10ML) SYRINGE FOR IV PUSH (FOR BLOOD PRESSURE SUPPORT)
80.0000 ug | PREFILLED_SYRINGE | INTRAVENOUS | Status: DC | PRN
  Filled 2017-05-18: qty 5

## 2017-05-18 MED ORDER — OXYTOCIN BOLUS FROM INFUSION
500.0000 mL | Freq: Once | INTRAVENOUS | Status: AC
  Administered 2017-05-18: 500 mL via INTRAVENOUS

## 2017-05-18 MED ORDER — IBUPROFEN 600 MG PO TABS
600.0000 mg | ORAL_TABLET | Freq: Four times a day (QID) | ORAL | Status: DC
  Administered 2017-05-18 – 2017-05-19 (×4): 600 mg via ORAL
  Filled 2017-05-18 (×4): qty 1

## 2017-05-18 NOTE — H&P (Signed)
37 y.o. [id number] @ 6086w2d presents with IOL for AMA.  Otherwise has good fetal movement and no bleeding.  Pregnancy c/b: 1. Advanced maternal age 37. Depression:  Restarted prozac 40mg  daily during pregnancy, established with therapist locally  Past Medical History:  Diagnosis Date  . ADD (attention deficit disorder)   . Allergy   . Anxiety   . Celiac disease   . Chicken pox   . Depression    prozac  . Hirsutism   . Migraine     Past Surgical History:  Procedure Laterality Date  . LASIK  2006  . TONSILLECTOMY    . VAGINAL DELIVERY      OB History  Gravida Para Term Preterm AB Living  3 1 1   1 1   SAB TAB Ectopic Multiple Live Births  1       1    # Outcome Date GA Lbr Len/2nd Weight Sex Delivery Anes PTL Lv  3 Current           2 Term  4196w0d  2.722 kg (6 lb) F Vag-Spont   LIV  1 SAB               Social History   Socioeconomic History  . Marital status: Married    Spouse name: Not on file  . Number of children: Not on file  . Years of education: Not on file  . Highest education level: Not on file  Tobacco Use  . Smoking status: Never Smoker  . Smokeless tobacco: Never Used  Substance and Sexual Activity  . Alcohol use: Yes  . Drug use: No  . Sexual activity: Yes  Other Topics Concern  . Not on file  Social History Narrative   Divorced   Works a Corporate treasurerassistant director of [employer]   1 daughter born 2015   [place]   [place] moved from [place].    Sulfa antibiotics; Chicken allergy; Eggs or egg-derived products; and Gluten meal    Prenatal Transfer Tool  Maternal Diabetes: No Genetic Screening: Normal NIPT Maternal Ultrasounds/Referrals: Normal Fetal Ultrasounds or other Referrals:  None Maternal Substance Abuse:  No Significant Maternal Medications:  Meds include: Prozac Significant Maternal Lab Results: Lab values include: Group B Strep negative  ABO, Rh: AB/Negative/-- (08/15 0000) Antibody: Positive  (08/15 0000) Rubella: Immune (08/15 0000) RPR: Nonreactive (08/15 0000)  HBsAg: Negative (08/15 0000)  HIV: Non-reactive (08/15 0000)  GBS: Negative (02/14 0000)          Vitals:   05/18/17 0747  BP: (!) 130/93  Pulse: 83  Resp: 18  Temp: 98.3 F (36.8 C)     General:  NAD Abdomen:  soft, gravid, EFW 6# Ex:  no edema SVE:  5/80/-1, AROM clear fluid FHTs:  120s, mod var, + accels, no decels  Toco:  Uterine irritability    A/P   [age] y.o. [id number] 4286w2d presents with IOL for advanced maternal age Will start pitocin now Epidural upon request Cont prozac in postpartum period  FSR/ vtx/ GBS negative  Istvan Behar GEFFEL Donique Hammonds

## 2017-05-18 NOTE — Anesthesia Procedure Notes (Signed)
Epidural Patient location during procedure: OB Start time: 05/18/2017 9:19 AM End time: 05/18/2017 9:24 AM  Staffing Anesthesiologist: Beryle LatheBrock, Thomas E, MD Performed: anesthesiologist   Preanesthetic Checklist Completed: patient identified, pre-op evaluation, timeout performed, IV checked, risks and benefits discussed and monitors and equipment checked  Epidural Patient position: sitting Prep: DuraPrep Patient monitoring: continuous pulse ox and blood pressure Approach: midline Location: L2-L3 Injection technique: LOR saline  Needle:  Needle type: Tuohy  Needle gauge: 17 G Needle length: 9 cm Needle insertion depth: 7 cm Catheter size: 19 Gauge Catheter at skin depth: 12 cm Test dose: negative and Other (1% lidocaine)  Additional Notes Patient identified. Risks including, but not limited to, bleeding, infection, nerve damage, paralysis, inadequate analgesia, blood pressure changes, nausea, vomiting, allergic reaction, postpartum back pain, itching, and headache were discussed. Patient expressed understanding and wished to proceed. Sterile prep and drape, including hand hygiene, mask, and sterile gloves were used. The patient was positioned and the spine was prepped. The skin was anesthetized with lidocaine. No paraesthesia or other complication noted. The patient did not experience any signs of intravascular injection such as tinnitus or metallic taste in mouth, nor signs of intrathecal spread such as rapid motor block. Please see nursing notes for vital signs. The patient tolerated the procedure well.   Leslye Peerhomas Brock, MDReason for block:procedure for pain

## 2017-05-18 NOTE — Anesthesia Postprocedure Evaluation (Signed)
Anesthesia Post Note  Patient: April LeatherwoodJennifer Moon  Procedure(s) Performed: AN AD HOC LABOR EPIDURAL     Patient location during evaluation: Mother Baby Anesthesia Type: Epidural Level of consciousness: awake and alert Pain management: pain level controlled Vital Signs Assessment: post-procedure vital signs reviewed and stable Respiratory status: spontaneous breathing, nonlabored ventilation and respiratory function stable Cardiovascular status: stable Postop Assessment: no headache, no backache, epidural receding, adequate PO intake, no apparent nausea or vomiting and patient able to bend at knees Anesthetic complications: no    Last Vitals:  Vitals:   05/18/17 1145 05/18/17 1200  BP: 120/68 120/84  Pulse: 66 67  Resp: 16 16  Temp:    SpO2:      Last Pain:  Vitals:   05/18/17 1200  TempSrc:   PainSc: 0-No pain   Pain Goal:                 Land O'LakesMalinova,Geraldy Akridge Hristova

## 2017-05-18 NOTE — Anesthesia Pain Management Evaluation Note (Signed)
  CRNA Pain Management Visit Note  Patient: April LeatherwoodJennifer Moon, 37 y.o., female  "Hello I am a member of the anesthesia team at [facility]. We have an anesthesia team available at all times to provide care throughout the hospital, including epidural management and anesthesia for C-section. I don't know your plan for the delivery whether it a natural birth, water birth, IV sedation, nitrous supplementation, doula or epidural, but we want to meet your pain goals."   1.Was your pain managed to your expectations on prior hospitalizations?   Yes   2.What is your expectation for pain management during this hospitalization?     Epidural  3.How can we help you reach that goal? unsure  Record the patient's initial score and the patient's pain goal.   Pain: 0  Pain Goal: 4 [facility] wants you to be able to say your pain was always managed very well.  Cephus ShellingBURGER,Stepan Verrette 05/18/2017

## 2017-05-18 NOTE — Anesthesia Preprocedure Evaluation (Signed)
Anesthesia Evaluation  Patient identified by MRN, date of birth, ID band Patient awake    Reviewed: Allergy & Precautions, NPO status , Patient's Chart, lab work & pertinent test results  Airway Mallampati: II  TM Distance: >3 FB Neck ROM: Full    Dental  (+) Dental Advisory Given   Pulmonary neg pulmonary ROS,    Pulmonary exam normal breath sounds clear to auscultation       Cardiovascular negative cardio ROS Normal cardiovascular exam Rhythm:Regular Rate:Normal     Neuro/Psych  Headaches, Anxiety Depression    GI/Hepatic Neg liver ROS, Celiac disease   Endo/Other  negative endocrine ROS  Renal/GU negative Renal ROS  negative genitourinary   Musculoskeletal negative musculoskeletal ROS (+)   Abdominal   Peds  (+) ATTENTION DEFICIT DISORDER WITHOUT HYPERACTIVITY Hematology  (+) anemia ,   Anesthesia Other Findings Hirsutism  Reproductive/Obstetrics (+) Pregnancy                             Anesthesia Physical Anesthesia Plan  ASA: II  Anesthesia Plan: Epidural   Post-op Pain Management:    Induction:   PONV Risk Score and Plan:   Airway Management Planned: Natural Airway  Additional Equipment:   Intra-op Plan:   Post-operative Plan:   Informed Consent: I have reviewed the patients History and Physical, chart, labs and discussed the procedure including the risks, benefits and alternatives for the proposed anesthesia with the patient or authorized representative who has indicated his/her understanding and acceptance.     Plan Discussed with:   Anesthesia Plan Comments: (Labs reviewed. Platelets acceptable, patient not taking any blood thinning medications. Risks and benefits discussed with patient, patient expressed understanding and wished to proceed.)        Anesthesia Quick Evaluation

## 2017-05-19 ENCOUNTER — Other Ambulatory Visit: Payer: Self-pay

## 2017-05-19 ENCOUNTER — Encounter (HOSPITAL_COMMUNITY): Payer: Self-pay

## 2017-05-19 LAB — CBC
HEMATOCRIT: 30 % — AB (ref 36.0–46.0)
Hemoglobin: 10 g/dL — ABNORMAL LOW (ref 12.0–15.0)
MCH: 25 pg — ABNORMAL LOW (ref 26.0–34.0)
MCHC: 33.3 g/dL (ref 30.0–36.0)
MCV: 75 fL — ABNORMAL LOW (ref 78.0–100.0)
PLATELETS: 310 10*3/uL (ref 150–400)
RBC: 4 MIL/uL (ref 3.87–5.11)
RDW: 15.8 % — AB (ref 11.5–15.5)
WBC: 9.5 10*3/uL (ref 4.0–10.5)

## 2017-05-19 NOTE — Lactation Note (Signed)
This note was copied from a baby's chart. Lactation Consultation Note  Patient Name: April Moon ZOXWR'UToday's Date: 05/19/2017 Reason for consult: Initial assessment;Infant weight loss;Term(5% weight loss )  Baby is 23 hours old  LC reviewed and updated the doc flow sheets  LC assisted mom to show her how to use the football position.  Swallows noted and baby fed for 20 mins, nipple well rounded when baby released.  Baby fell asleep, after feeding for short interval and then LC assisted to latch on the left breast  Modified cradle/ multiple swallows noted.  Sore nipple and engorgement prevention and tx reviewed.  Per mom will have a DEBP at home.  LC reviewed basics , discussed nutritive vs non nutritive feeding patterns an dot watch for  Hanging out latched. STS feedings until the baby can stay awake for feedings.  Mother informed of post-discharge support and given phone number to the lactation department, including services for phone call assistance; out-patient appointments; and breastfeeding support group. List of other breastfeeding resources in the community given in the handout. Encouraged mother to call for problems or concerns related to breastfeeding.   Maternal Data Does the patient have breastfeeding experience prior to this delivery?: Yes  Feeding Feeding Type: Breast Fed  LATCH Score Latch: Grasps breast easily, tongue down, lips flanged, rhythmical sucking.  Audible Swallowing: Spontaneous and intermittent  Type of Nipple: Everted at rest and after stimulation  Comfort (Breast/Nipple): Soft / non-tender  Hold (Positioning): Assistance needed to correctly position infant at breast and maintain latch.  LATCH Score: 9  Interventions Interventions: Breast feeding basics reviewed;Assisted with latch;Skin to skin;Breast massage;Breast compression;Adjust position;Support pillows;Position options  Lactation Tools Discussed/Used WIC Program: No Pump Review: Milk  Storage   Consult Status Consult Status: Complete    Matilde SprangMargaret Ann Twania Bujak 05/19/2017, 10:15 AM

## 2017-05-19 NOTE — Progress Notes (Addendum)
CSW received consult for hx of Anxiety/Depression and 12 on EDPS.  CSW met with MOB to offer support and complete assessment.    When CSW arrived MOB was breastfeeding infant and FOB was observing their interactions.  MOB gave CSW permission to complete the assessment while FOB was present.  FOB was supportive during the session and attentive to MOB's needs.  MOB was forthcoming and receptive to meeting with CSW.  CSW reviewed MOB's EDPS results and MOB acknowledged that she misread question 10 and her response should be 0. CSW   MOB also acknowledge a hx of anxiety and depression and is currently taking Prozac.  MOB reported that MOB's medication is managed by MOB's PCP.  MOB also communicated that MOB receives outpatient counseling at [facility] (next appointment is 05/25/2017). MOB did not present with any acute symptoms during the assessment and communicated with insight and awareness. CSW provided education regarding the baby blues period vs. perinatal mood disorders, discussed treatment and gave resources for mental health follow up if concerns arise.  CSW recommends self-evaluation during the postpartum time period using the New Mom Checklist from Postpartum Progress and encouraged MOB to contact a medical professional if symptoms are noted at any time.  CSW informed MOB of supports groups offered by the hospital an encouraged MOB to attend. MOB and FOB reported a good support team and they shared they both feel prepared to parent a new baby.    CSW identifies no further need for intervention and no barriers to discharge at this time.  Laurey Arrow, MSW, LCSW Clinical Social Work [phone]

## 2017-05-19 NOTE — Discharge Summary (Signed)
Obstetric Discharge Summary Reason for Admission: induction of labor Prenatal Procedures: none Intrapartum Procedures: spontaneous vaginal delivery Postpartum Procedures: none Complications-Operative and Postpartum: none Hemoglobin  Date Value Ref Range Status  05/19/2017 10.0 (L) 12.0 - 15.0 g/dL Final   HCT  Date Value Ref Range Status  05/19/2017 30.0 (L) 36.0 - 46.0 % Final      Discharge Diagnoses: Term Pregnancy-delivered  Discharge Information: Date: 05/19/2017 Activity: pelvic rest Diet: routine Medications: Ibuprofen and Iron Condition: stable Instructions: refer to practice specific booklet Discharge to: home Follow-up Information    Marlow Baarslark, Dyanna, MD. Call in 4 day(s).   Specialty:  Obstetrics Contact information: [address] [place] [id number] [phone]           Newborn Data: Live born female  Birth Weight: 7 lb 5.1 oz (3320 g) APGAR: 9, 9  Newborn Delivery   Birth date/time:  05/18/2017 10:59:00 Delivery type:  Vaginal, Spontaneous     Home with mother.  Fleeta Kunde A 05/19/2017, 7:01 AM

## 2017-05-19 NOTE — Progress Notes (Signed)
Patient is eating, ambulating, voiding.  Pain control is good.  Vitals:   05/18/17 1400 05/18/17 1532 05/18/17 2300 05/19/17 0500  BP: 129/84 119/73 118/71 107/73  Pulse: 69 81 69 (!) 57  Resp: 18 20 20 16   Temp: 98.9 F (37.2 C) 98 F (36.7 C) 97.6 F (36.4 C) 97.9 F (36.6 C)  TempSrc: Oral Oral Oral Oral  SpO2:      Weight:      Height:        Fundus firm Perineum without swelling.  Lab Results  Component Value Date   WBC 9.5 05/19/2017   HGB 10.0 (L) 05/19/2017   HCT 30.0 (L) 05/19/2017   MCV 75.0 (L) 05/19/2017   PLT 310 05/19/2017    --/--/AB NEG (03/07 0800)/RI  A/P Post partum day 1.   Baby is B neg- no Rhogam.  Routine care.  Expect d/c routine.    Makell Cyr A

## 2017-05-20 LAB — BPAM RBC
BLOOD PRODUCT EXPIRATION DATE: 201904062359
Blood Product Expiration Date: 201904062359
UNIT TYPE AND RH: 9500
UNIT TYPE AND RH: 9500

## 2017-05-20 LAB — TYPE AND SCREEN
ABO/RH(D): AB NEG
Antibody Screen: POSITIVE
Unit division: 0
Unit division: 0

## 2017-05-21 ENCOUNTER — Emergency Department (HOSPITAL_COMMUNITY): Payer: 59

## 2017-05-21 ENCOUNTER — Emergency Department (HOSPITAL_COMMUNITY)
Admission: EM | Admit: 2017-05-21 | Discharge: 2017-05-21 | Disposition: A | Discharge status: Home / Self Care | Payer: 59 | Attending: Emergency Medicine | Admitting: Emergency Medicine

## 2017-05-21 ENCOUNTER — Other Ambulatory Visit: Payer: Self-pay

## 2017-05-21 ENCOUNTER — Encounter (HOSPITAL_COMMUNITY): Payer: Self-pay

## 2017-05-21 DIAGNOSIS — R0789 Other chest pain: Secondary | ICD-10-CM | POA: Diagnosis not present

## 2017-05-21 DIAGNOSIS — Z79899 Other long term (current) drug therapy: Secondary | ICD-10-CM | POA: Diagnosis not present

## 2017-05-21 DIAGNOSIS — R1011 Right upper quadrant pain: Secondary | ICD-10-CM | POA: Insufficient documentation

## 2017-05-21 DIAGNOSIS — R079 Chest pain, unspecified: Secondary | ICD-10-CM | POA: Diagnosis present

## 2017-05-21 DIAGNOSIS — R1010 Upper abdominal pain, unspecified: Secondary | ICD-10-CM | POA: Insufficient documentation

## 2017-05-21 LAB — BASIC METABOLIC PANEL
Anion gap: 8 (ref 5–15)
BUN: 5 mg/dL — ABNORMAL LOW (ref 6–20)
CALCIUM: 8.3 mg/dL — AB (ref 8.9–10.3)
CO2: 23 mmol/L (ref 22–32)
CREATININE: 0.44 mg/dL (ref 0.44–1.00)
Chloride: 107 mmol/L (ref 101–111)
GFR calc non Af Amer: >60 mL/min (ref >60)
Glucose, Bld: 81 mg/dL (ref 65–99)
Potassium: 3.8 mmol/L (ref 3.5–5.1)
SODIUM: 138 mmol/L (ref 135–145)

## 2017-05-21 LAB — CBC
HCT: 34.9 % — ABNORMAL LOW (ref 36.0–46.0)
Hemoglobin: 10.8 g/dL — ABNORMAL LOW (ref 12.0–15.0)
MCH: 24.3 pg — ABNORMAL LOW (ref 26.0–34.0)
MCHC: 30.9 g/dL (ref 30.0–36.0)
MCV: 78.6 fL (ref 78.0–100.0)
PLATELETS: 462 10*3/uL — AB (ref 150–400)
RBC: 4.44 MIL/uL (ref 3.87–5.11)
RDW: 16.1 % — ABNORMAL HIGH (ref 11.5–15.5)
WBC: 10.8 10*3/uL — AB (ref 4.0–10.5)

## 2017-05-21 LAB — HEPATIC FUNCTION PANEL
ALK PHOS: 146 U/L — AB (ref 38–126)
ALT: 45 U/L (ref 14–54)
AST: 48 U/L — ABNORMAL HIGH (ref 15–41)
Albumin: 2.6 g/dL — ABNORMAL LOW (ref 3.5–5.0)
BILIRUBIN DIRECT: 0.1 mg/dL (ref 0.1–0.5)
BILIRUBIN INDIRECT: 0.4 mg/dL (ref 0.3–0.9)
Total Bilirubin: 0.5 mg/dL (ref 0.3–1.2)
Total Protein: 6.4 g/dL — ABNORMAL LOW (ref 6.5–8.1)

## 2017-05-21 LAB — PROTIME-INR
INR: 0.84
Prothrombin Time: 11.4 seconds (ref 11.4–15.2)

## 2017-05-21 LAB — I-STAT BETA HCG BLOOD, ED (MC, WL, AP ONLY)

## 2017-05-21 LAB — I-STAT TROPONIN, ED: TROPONIN I, POC: 0 ng/mL (ref 0.00–0.08)

## 2017-05-21 LAB — LIPASE, BLOOD: Lipase: 33 U/L (ref 11–51)

## 2017-05-21 MED ORDER — PANTOPRAZOLE SODIUM 40 MG PO TBEC: 40.0000 mg | DELAYED_RELEASE_TABLET | Freq: Every day | ORAL | 0 refills | Status: DC

## 2017-05-21 MED ORDER — MORPHINE SULFATE (PF) 4 MG/ML IV SOLN
8.0000 mg | Freq: Once | INTRAVENOUS | Status: AC
  Administered 2017-05-21: 8 mg via INTRAVENOUS
  Filled 2017-05-21: qty 2

## 2017-05-21 MED ORDER — IOPAMIDOL (ISOVUE-370) INJECTION 76%
INTRAVENOUS | Status: AC
  Administered 2017-05-21: 100 mL
  Filled 2017-05-21: qty 100

## 2017-05-21 MED ORDER — SODIUM CHLORIDE 0.9 % IJ SOLN
INTRAMUSCULAR | Status: AC
  Filled 2017-05-21: qty 50

## 2017-05-21 MED ORDER — SODIUM CHLORIDE 0.9 % IV BOLUS (SEPSIS)
1000.0000 mL | Freq: Once | INTRAVENOUS | Status: AC
  Administered 2017-05-21: 1000 mL via INTRAVENOUS

## 2017-05-21 NOTE — ED Triage Notes (Signed)
She c/o bilat. Ribs/lower chest/thoracic pain since yesterday evening. She is 3 days post-partum. She is pale and in no distress.

## 2017-05-21 NOTE — ED Notes (Signed)
No respiratory or acute distress noted resting in bed with eyes closed no reaction to medication noted husband and child at bedside.

## 2017-05-21 NOTE — ED Notes (Signed)
Bed: WLPT1 Expected date:  Expected time:  Means of arrival:  Comments: 

## 2017-05-21 NOTE — ED Notes (Signed)
Repeat EKG given to EDP,Wentz,MD., for review.

## 2017-05-21 NOTE — ED Notes (Signed)
No respiratory or acute distress noted alert and oriented x 3 call light in reach speaks in full sentences.no labored respirations noted.

## 2017-05-21 NOTE — ED Notes (Signed)
Pt is breastfeeding her baby at this time, unable to obtain ekg

## 2017-05-21 NOTE — ED Provider Notes (Signed)
[facility]-EMERGENCY DEPT Provider Note   CSN: [id number] Arrival date & time: 05/21/17  1843     History   Chief Complaint Chief Complaint  Patient presents with  . Chest Pain    HPI April Moon is a 37 y.o. female.  HPI  37 year old female presents with severe chest pain and back pain.  Chest pain is in her sternum and under her right breast as well as in her back.  Started yesterday.  It has been constant but seems to worsen.  She delivered a baby on 3/7.  She had no complaints while in the hospital and was admitted for about 24 hours.  It was a vaginal birth.  Her vaginal bleeding has significantly slowed down.  She denies any dyspnea but the pain is significantly worse with breathing.  No trauma.  Called her OB/GYN to be seen who told him to come to the ER to help rule out blood clots.  Denies any leg swelling or leg pain. Pain is dull and then becomes sharp. Seem worse with lying flat. No history of prior DVT/PE or family history.  Past Medical History:  Diagnosis Date  . ADD (attention deficit disorder)   . Allergy   . Anxiety   . Celiac disease   . Chicken pox   . Depression    prozac  . Hirsutism   . Migraine     Patient Active Problem List   Diagnosis Date Noted  . Advanced maternal age in multigravida, third trimester 05/18/2017  . ADD (attention deficit disorder)   . Hirsutism   . Migraine   . Anxiety   . Celiac disease 01/01/2016  . Depression 01/01/2016  . Environmental allergies 01/01/2016    Past Surgical History:  Procedure Laterality Date  . LASIK  2006  . TONSILLECTOMY    . VAGINAL DELIVERY      OB History    Gravida Para Term Preterm AB Living   3 2 2   1 2    SAB TAB Ectopic Multiple Live Births   1     0 2       Home Medications    Prior to Admission medications   Medication Sig Start Date End Date Taking? Authorizing Provider  FLUoxetine (PROZAC) 40 MG capsule TAKE ONE CAPSULE BY MOUTH DAILY 08/07/16  Yes  Sandford Craze'Sullivan, Melissa, NP  Prenatal Vit-Fe Fumarate-FA (PRENATAL MULTIVITAMIN) TABS tablet Take 1 tablet by mouth daily at 12 noon.   Yes [provider]  pantoprazole (PROTONIX) 40 MG tablet Take 1 tablet (40 mg total) by mouth daily. 05/21/17   Pricilla LovelessGoldston, Adrik Khim, MD    Family History Family History  Problem Relation Age of Onset  . Breast cancer Paternal Grandmother   . Autoimmune disease Mother        Langerhan's cell histiocytosis    Social History Social History   Tobacco Use  . Smoking status: Never Smoker  . Smokeless tobacco: Never Used  Substance Use Topics  . Alcohol use: Yes  . Drug use: No     Allergies   Sulfa antibiotics; Chicken allergy; Eggs or egg-derived products; and Gluten meal   Review of Systems Review of Systems  Constitutional: Negative for fever.  Respiratory: Negative for shortness of breath.   Cardiovascular: Positive for chest pain. Negative for leg swelling.  All other systems reviewed and are negative.    Physical Exam Updated Vital Signs BP 122/62 (BP Location: Right Arm)   Pulse 77   Temp  98.4 F (36.9 C) (Oral)   Resp 12   LMP 08/16/2016 Comment: 05-21-2017 3 days post-partum  SpO2 100%   Breastfeeding? Yes   Physical Exam  Constitutional: She is oriented to person, place, and time. She appears well-developed and well-nourished.  HENT:  Head: Normocephalic and atraumatic.  Right Ear: External ear normal.  Left Ear: External ear normal.  Nose: Nose normal.  Eyes: Right eye exhibits no discharge. Left eye exhibits no discharge.  Cardiovascular: Normal rate, regular rhythm and normal heart sounds.  Pulmonary/Chest: Effort normal and breath sounds normal. She exhibits no tenderness.  Abdominal: Soft. There is tenderness in the right upper quadrant and epigastric area.  Musculoskeletal: She exhibits no edema.  Neurological: She is alert and oriented to person, place, and time.  Skin: Skin is warm and dry. She is not  diaphoretic.  Nursing note and vitals reviewed.    ED Treatments / Results  Labs (all labs ordered are listed, but only abnormal results are displayed) Labs Reviewed  BASIC METABOLIC PANEL - Abnormal; Notable for the following components:      Result Value   BUN 5 (*)    Calcium 8.3 (*)    All other components within normal limits  CBC - Abnormal; Notable for the following components:   WBC 10.8 (*)    Hemoglobin 10.8 (*)    HCT 34.9 (*)    MCH 24.3 (*)    RDW 16.1 (*)    Platelets 462 (*)    All other components within normal limits  HEPATIC FUNCTION PANEL - Abnormal; Notable for the following components:   Total Protein 6.4 (*)    Albumin 2.6 (*)    AST 48 (*)    Alkaline Phosphatase 146 (*)    All other components within normal limits  I-STAT BETA HCG BLOOD, ED ([facility], [facility], [facility] ONLY) - Abnormal; Notable for the following components:   I-stat hCG, quantitative >2,000.0 (*)    All other components within normal limits  PROTIME-INR  LIPASE, BLOOD  I-STAT TROPONIN, ED    EKG  EKG Interpretation None       Radiology Dg Chest 2 View  Result Date: 05/21/2017 CLINICAL DATA:  She c/o bilat. Ribs/lower chest/thoracic pain since yesterday evening. Non smoker EXAM: CHEST - 2 VIEW COMPARISON:  None. FINDINGS: The heart size and mediastinal contours are within normal limits. Both lungs are clear. No pleural effusion or pneumothorax. The visualized skeletal structures are unremarkable. IMPRESSION: No active cardiopulmonary disease. Electronically Signed   By: Amie Portland M.D.   On: 05/21/2017 19:35   Ct Angio Chest Pe W And/or Wo Contrast  Result Date: 05/21/2017 CLINICAL DATA:  37 year old female with bilateral lower extremity and chest pain. Concern for pulmonary embolism EXAM: CT ANGIOGRAPHY CHEST WITH CONTRAST TECHNIQUE: Multidetector CT imaging of the chest was performed using the standard protocol during bolus administration of intravenous contrast. Multiplanar CT image  reconstructions and MIPs were obtained to evaluate the vascular anatomy. CONTRAST:  ISOVUE-370 IOPAMIDOL (ISOVUE-370) INJECTION 76% COMPARISON:  Chest radiograph dated 05/21/2017 FINDINGS: Cardiovascular: Top-normal cardiac size. No pericardial effusion. The thoracic aorta is unremarkable. The origins of the great vessels of the aortic arch are patent. There is no CT evidence of pulmonary embolism. Mediastinum/Nodes: No hilar or mediastinal adenopathy. Esophagus and thyroid gland are grossly unremarkable. No mediastinal fluid collection. Lungs/Pleura: A 3 mm left apical subpleural nodule. The lungs are clear. There is no pleural effusion or pneumothorax. Areas of lower attenuation at the right  lung base likely represents air trapping. The central airways are patent. Upper Abdomen: There is apparent inflammatory changes and haziness of the fat surrounding the gastric antrum and pylorus. This area is partially visualized and suboptimally evaluated. Findings may represent gastritis or duodenitis or secondary to inflammatory changes of the pancreas. Clinical correlation is recommended. Musculoskeletal: No chest wall abnormality. No acute or significant osseous findings. Review of the MIP images confirms the above findings. IMPRESSION: 1. No acute intrathoracic pathology. No CT evidence of pulmonary embolism. 2. Partially visualized area of haziness and inflammatory changes involving the distal stomach and proximal duodenum. Findings may represent gastroenteritis or related to inflammatory changes of the pancreas. Clinical correlation is recommended. Electronically Signed   By: Elgie Collard M.D.   On: 05/21/2017 21:18   US Abdomen Limited Ruq  Result Date: 05/21/2017 CLINICAL DATA:  Acute onset of right upper quadrant abdominal pain. EXAM: ULTRASOUND ABDOMEN LIMITED RIGHT UPPER QUADRANT COMPARISON:  Pelvic ultrasound performed 10/22/2016 FINDINGS: Gallbladder: No gallstones or wall thickening visualized. No  sonographic Murphy sign noted by sonographer. Common bile duct: Diameter: 0.4 cm, within normal limits in caliber. Liver: No focal lesion identified. Within normal limits in parenchymal echogenicity. Portal vein is patent on color Doppler imaging with normal direction of blood flow towards the liver. IMPRESSION: Unremarkable ultrasound of the right upper quadrant. Electronically Signed   By: Roanna Raider M.D.   On: 05/21/2017 22:46    Procedures Procedures (including critical care time)  Medications Ordered in ED Medications  sodium chloride 0.9 % injection (not administered)  morphine 4 MG/ML injection 8 mg (8 mg Intravenous Given 05/21/17 2034)  sodium chloride 0.9 % bolus 1,000 mL (0 mLs Intravenous Stopped 05/21/17 2135)  iopamidol (ISOVUE-370) 76 % injection (100 mLs  Contrast Given 05/21/17 2045)     Initial Impression / Assessment and Plan / ED Course  I have reviewed the triage vital signs and the nursing notes.  Pertinent labs & imaging results that were available during my care of the patient were reviewed by me and considered in my medical decision making (see chart for details).     Given patient's postpartum state a CT was obtained to rule out PE given high clinical concern given sharp chest pain that is pleuritic with no other clear etiology.  CT unremarkable in the chest except for some haziness in her upper abdomen.  Thus a right upper quadrant ultrasound and LFTs/lipase added on.  These are unremarkable.  Patient's chest pain is likely coming from her abdomen.  This is likely a gastritis.  She has been on 800 mg ibuprofen every 6 hours since her delivery.  She states that her abdominal pain is minimal since the delivery and she is not sure she needs that much ibuprofen.  She was counseled to stop.  She will also be started on PPI. On review of recommendations for PPI and breast-feeding, this appears to be okay.  We discussed this with patient and she wants to start.  Given she  was given a dose of morphine for severe pain, she will be instructed to pump and dump for 24 hours.  Follow-up with OB/GYN.  Discussed return precautions.  Final Clinical Impressions(s) / ED Diagnoses   Final diagnoses:  Upper abdominal pain  Atypical chest pain    ED Discharge Orders        Ordered    pantoprazole (PROTONIX) 40 MG tablet  Daily     05/21/17 2306  Pricilla Loveless, MD 05/21/17 [phone]

## 2018-02-06 ENCOUNTER — Encounter: Payer: Self-pay | Admitting: Gastroenterology

## 2018-03-09 ENCOUNTER — Ambulatory Visit: Payer: 59 | Admitting: Gastroenterology

## 2018-07-11 ENCOUNTER — Telehealth: Payer: Self-pay | Admitting: *Deleted

## 2018-07-11 NOTE — Telephone Encounter (Signed)
Pt has not seen PCP since 2017. Attempted to reach pt to verify PCP. Left detailed message to call and let us know if she is still seeing Melissa for PCP and if so, she is due for cpe and we can schedule her for a cpe some time in July. If she is not seeing [place] for PCP, we need to remove Melissa from PCP status.

## 2019-10-14 ENCOUNTER — Other Ambulatory Visit: Payer: Self-pay | Admitting: Obstetrics

## 2019-10-14 DIAGNOSIS — N644 Mastodynia: Secondary | ICD-10-CM

## 2019-10-25 ENCOUNTER — Ambulatory Visit: Payer: No Typology Code available for payment source

## 2019-10-25 ENCOUNTER — Ambulatory Visit
Admission: RE | Admit: 2019-10-25 | Discharge: 2019-10-25 | Disposition: A | Discharge status: Home / Self Care | Payer: No Typology Code available for payment source | Source: Ambulatory Visit | Attending: Obstetrics | Admitting: Obstetrics

## 2019-10-25 ENCOUNTER — Other Ambulatory Visit: Payer: Self-pay

## 2019-10-25 DIAGNOSIS — N644 Mastodynia: Secondary | ICD-10-CM

## 2020-09-17 ENCOUNTER — Emergency Department (HOSPITAL_BASED_OUTPATIENT_CLINIC_OR_DEPARTMENT_OTHER): Payer: BC Managed Care – PPO

## 2020-09-17 ENCOUNTER — Other Ambulatory Visit: Payer: Self-pay

## 2020-09-17 ENCOUNTER — Emergency Department (HOSPITAL_BASED_OUTPATIENT_CLINIC_OR_DEPARTMENT_OTHER)
Admission: EM | Admit: 2020-09-17 | Discharge: 2020-09-18 | Disposition: A | Discharge status: Home / Self Care | Payer: BC Managed Care – PPO | Attending: Emergency Medicine | Admitting: Emergency Medicine

## 2020-09-17 ENCOUNTER — Encounter (HOSPITAL_BASED_OUTPATIENT_CLINIC_OR_DEPARTMENT_OTHER): Payer: Self-pay | Admitting: Emergency Medicine

## 2020-09-17 DIAGNOSIS — E876 Hypokalemia: Secondary | ICD-10-CM | POA: Insufficient documentation

## 2020-09-17 DIAGNOSIS — R059 Cough, unspecified: Secondary | ICD-10-CM | POA: Diagnosis present

## 2020-09-17 DIAGNOSIS — U071 COVID-19: Secondary | ICD-10-CM | POA: Diagnosis not present

## 2020-09-17 LAB — CBC WITH DIFFERENTIAL/PLATELET
Abs Immature Granulocytes: 0.01 10*3/uL (ref 0.00–0.07)
Basophils Absolute: 0 10*3/uL (ref 0.0–0.1)
Basophils Relative: 1 %
Eosinophils Absolute: 0 10*3/uL (ref 0.0–0.5)
Eosinophils Relative: 1 %
HCT: 40.6 % (ref 36.0–46.0)
Hemoglobin: 13 g/dL (ref 12.0–15.0)
Immature Granulocytes: 0 %
Lymphocytes Relative: 29 %
Lymphs Abs: 1.5 10*3/uL (ref 0.7–4.0)
MCH: 25.2 pg — ABNORMAL LOW (ref 26.0–34.0)
MCHC: 32 g/dL (ref 30.0–36.0)
MCV: 78.8 fL — ABNORMAL LOW (ref 80.0–100.0)
Monocytes Absolute: 0.4 10*3/uL (ref 0.1–1.0)
Monocytes Relative: 8 %
Neutro Abs: 3.2 10*3/uL (ref 1.7–7.7)
Neutrophils Relative %: 61 %
Platelets: 303 10*3/uL (ref 150–400)
RBC: 5.15 MIL/uL — ABNORMAL HIGH (ref 3.87–5.11)
RDW: 15.4 % (ref 11.5–15.5)
WBC: 5.1 10*3/uL (ref 4.0–10.5)
nRBC: 0 % (ref 0.0–0.2)

## 2020-09-17 LAB — TROPONIN I (HIGH SENSITIVITY): Troponin I (High Sensitivity): <2 ng/L (ref <18)

## 2020-09-17 LAB — D-DIMER, QUANTITATIVE: D-Dimer, Quant: <0.27 ug/mL-FEU (ref 0.00–0.50)

## 2020-09-17 LAB — BASIC METABOLIC PANEL
Anion gap: 10 (ref 5–15)
BUN: 9 mg/dL (ref 6–20)
CO2: 24 mmol/L (ref 22–32)
Calcium: 8.4 mg/dL — ABNORMAL LOW (ref 8.9–10.3)
Chloride: 104 mmol/L (ref 98–111)
Creatinine, Ser: 0.73 mg/dL (ref 0.44–1.00)
GFR, Estimated: >60 mL/min (ref >60)
Glucose, Bld: 89 mg/dL (ref 70–99)
Potassium: 3.3 mmol/L — ABNORMAL LOW (ref 3.5–5.1)
Sodium: 138 mmol/L (ref 135–145)

## 2020-09-17 LAB — HCG, SERUM, QUALITATIVE: Preg, Serum: NEGATIVE

## 2020-09-17 MED ORDER — ALBUTEROL SULFATE HFA 108 (90 BASE) MCG/ACT IN AERS
1.0000 | INHALATION_SPRAY | Freq: Four times a day (QID) | RESPIRATORY_TRACT | 0 refills | Status: DC | PRN
  Filled 2020-09-18: qty 8.5, 25d supply, fill #0

## 2020-09-17 MED ORDER — NIRMATRELVIR/RITONAVIR (PAXLOVID)TABLET
3.0000 | ORAL_TABLET | Freq: Two times a day (BID) | ORAL | 0 refills | Status: AC
  Filled 2020-09-18: qty 30, 5d supply, fill #0

## 2020-09-17 MED ORDER — BENZONATATE 100 MG PO CAPS
100.0000 mg | ORAL_CAPSULE | Freq: Three times a day (TID) | ORAL | 0 refills | Status: DC
  Filled 2020-09-18: qty 21, 7d supply, fill #0

## 2020-09-17 MED ORDER — FLUTICASONE PROPIONATE 50 MCG/ACT NA SUSP
2.0000 | Freq: Every day | NASAL | 0 refills | Status: DC
  Filled 2020-09-18: qty 16, 30d supply, fill #0

## 2020-09-17 NOTE — ED Provider Notes (Signed)
[facility] EMERGENCY DEPARTMENT Provider Note   CSN: [id number] Arrival date & time: 09/17/20  1926     History Chief Complaint  Patient presents with  . Covid Positive    Shortness of breath, cp    April Moon is a 40 y.o. female with past medical history significant for anxiety, allergy, celiac who presents for evaluation of cough and shortness of breath.  Patient diagnosed with COVID on Monday, 4 days PTA.  She has had some myalgias, shortness of breath with coughing, fever, fatigue and generalized weakness.  Cough is nonproductive.  No hemoptysis.  Has some chest pain when she coughs.  No exertional, nonpleuritic chest pain.  No unilateral leg swelling, redness or warmth.  Pain 0/10.  Denies additional aggravating or relieving factors.  Has been taking Tylenol at home without relief of symptoms.  History obtained from patient and past medical records.  No interpreter used.  HPI     Past Medical History:  Diagnosis Date  . ADD (attention deficit disorder)   . Allergy   . Anxiety   . Carpal tunnel syndrome, right   . Celiac disease   . Chicken pox   . Depression    prozac  . Hirsutism   . Migraine     Patient Active Problem List   Diagnosis Date Noted  . Advanced maternal age in multigravida, third trimester 05/18/2017  . ADD (attention deficit disorder)   . Hirsutism   . Migraine   . Anxiety   . Celiac disease 01/01/2016  . Depression 01/01/2016  . Environmental allergies 01/01/2016    Past Surgical History:  Procedure Laterality Date  . LASIK  2006  . TONSILLECTOMY    . VAGINAL DELIVERY       OB History     Gravida  3   Para  2   Term  2   Preterm      AB  1   Living  2      SAB  1   IAB      Ectopic      Multiple  0   Live Births  2           Family History  Problem Relation Age of Onset  . Breast cancer Paternal Grandmother   . Stomach cancer Paternal Grandmother   . Ovarian cancer Paternal Grandmother   .  Autoimmune disease Mother        Langerhan's cell histiocytosis  . Barrett's esophagus Father   . Breast cancer Maternal Grandmother   . Breast cancer Maternal Aunt     Social History   Tobacco Use  . Smoking status: Never  . Smokeless tobacco: Never  Substance Use Topics  . Alcohol use: Yes  . Drug use: No    Home Medications Prior to Admission medications   Medication Sig Start Date End Date Taking? Authorizing Provider  albuterol (VENTOLIN HFA) 108 (90 Base) MCG/ACT inhaler Inhale 1-2 puffs into the lungs every 6 (six) hours as needed for wheezing or shortness of breath. 09/17/20  Yes Felicite Zeimet A, PA-C  benzonatate (TESSALON) 100 MG capsule Take 1 capsule (100 mg total) by mouth every 8 (eight) hours. 09/17/20  Yes Lynzi Meulemans A, PA-C  fluticasone (FLONASE) 50 MCG/ACT nasal spray Place 2 sprays into both nostrils daily. 09/17/20  Yes Juaquina Machnik A, PA-C  nirmatrelvir/ritonavir EUA (PAXLOVID) TABS Take 3 tablets by mouth 2 (two) times daily for 5 days. Patient GFR is normal Take nirmatrelvir (150  mg) two tablets twice daily for 5 days and ritonavir (100 mg) one tablet twice daily for 5 days. 09/17/20 09/22/20 Yes Tamana Hatfield A, PA-C  FLUoxetine (PROZAC) 40 MG capsule TAKE ONE CAPSULE BY MOUTH DAILY 08/07/16   Sandford Craze'Sullivan, Melissa, NP  pantoprazole (PROTONIX) 40 MG tablet Take 1 tablet (40 mg total) by mouth daily. 05/21/17   Pricilla LovelessGoldston, Scott, MD  Prenatal Vit-Fe Fumarate-FA (PRENATAL MULTIVITAMIN) TABS tablet Take 1 tablet by mouth daily at 12 noon.    [provider]    Allergies    Sulfa antibiotics, Chicken allergy, Eggs or egg-derived products, and Gluten meal  Review of Systems   Review of Systems  Constitutional:  Positive for activity change, appetite change, fatigue and fever.  HENT:  Positive for congestion and rhinorrhea.   Respiratory:  Positive for cough and shortness of breath. Negative for apnea, choking, chest tightness, wheezing and stridor.    Cardiovascular: Negative.   Gastrointestinal: Negative.   Genitourinary: Negative.   Musculoskeletal:  Positive for myalgias.  Skin: Negative.   Neurological:  Positive for weakness (Generalized).  All other systems reviewed and are negative.  Physical Exam Updated Vital Signs BP 113/89   Pulse 99   Temp 99.8 F (37.7 C) (Oral)   Resp 18   LMP 09/13/2020 (Exact Date)   SpO2 99%   Physical Exam Vitals and nursing note reviewed.  Constitutional:      General: She is not in acute distress.    Appearance: She is well-developed. She is not ill-appearing, toxic-appearing or diaphoretic.  HENT:     Head: Normocephalic and atraumatic.     Nose: Nose normal.     Mouth/Throat:     Mouth: Mucous membranes are moist.  Eyes:     Pupils: Pupils are equal, round, and reactive to light.  Cardiovascular:     Rate and Rhythm: Normal rate.     Pulses: Normal pulses.     Heart sounds: Normal heart sounds.  Pulmonary:     Effort: Pulmonary effort is normal. No respiratory distress.     Breath sounds: Normal breath sounds.     Comments: Speaks in full sentences without difficulty Abdominal:     General: Bowel sounds are normal. There is no distension.     Palpations: Abdomen is soft.     Tenderness: There is no abdominal tenderness. There is no right CVA tenderness, left CVA tenderness, guarding or rebound.     Comments: Soft, nontender  Musculoskeletal:        General: No swelling, tenderness, deformity or signs of injury. Normal range of motion.     Cervical back: Normal range of motion.     Right lower leg: No edema.     Left lower leg: No edema.     Comments: No bony tenderness.  Moves all 4 extremities without difficulty.  Skin:    General: Skin is warm and dry.     Capillary Refill: Capillary refill takes less than 2 seconds.  Neurological:     General: No focal deficit present.     Mental Status: She is alert and oriented to person, place, and time.  Psychiatric:         Mood and Affect: Mood normal.    ED Results / Procedures / Treatments   Labs (all labs ordered are listed, but only abnormal results are displayed) Labs Reviewed  CBC WITH DIFFERENTIAL/PLATELET - Abnormal; Notable for the following components:      Result Value   RBC  5.15 (*)    MCV 78.8 (*)    MCH 25.2 (*)    All other components within normal limits  BASIC METABOLIC PANEL - Abnormal; Notable for the following components:   Potassium 3.3 (*)    Calcium 8.4 (*)    All other components within normal limits  D-DIMER, QUANTITATIVE  HCG, SERUM, QUALITATIVE  TROPONIN I (HIGH SENSITIVITY)  TROPONIN I (HIGH SENSITIVITY)    EKG EKG Interpretation  Date/Time:  Thursday September 17 2020 19:35:26 EDT Ventricular Rate:  96 PR Interval:  134 QRS Duration: 76 QT Interval:  326 QTC Calculation: 411 R Axis:   77 Text Interpretation: Normal sinus rhythm Nonspecific ST and T wave abnormality Abnormal ECG Confirmed by Benjiman Core [phone]) on 09/17/2020 10:41:02 PM  Radiology DG Chest Portable 1 View  Result Date: 09/17/2020 CLINICAL DATA:  COVID positive.  Shortness of breath. EXAM: PORTABLE CHEST 1 VIEW COMPARISON:  05/21/2017 FINDINGS: The lungs are clear without focal pneumonia, edema, pneumothorax or pleural effusion. The cardiopericardial silhouette is within normal limits for size. The visualized bony structures of the thorax show no acute abnormality. IMPRESSION: No active disease. Electronically Signed   By: Kennith Center M.D.   On: 09/17/2020 19:56    Procedures Procedures   Medications Ordered in ED Medications - No data to display  ED Course  I have reviewed the triage vital signs and the nursing notes.  Pertinent labs & imaging results that were available during my care of the patient were reviewed by me and considered in my medical decision making (see chart for details).  Here for evaluation of COVID symptoms.  Tested +4 days PTA.  She is afebrile, nonseptic appearing.   Her heart and lungs are clear.  Her abdomen is soft, nontender.  No neck stiffness neck rigidity.  She has no meningismus.  No clinical evidence of DVT on exam.  She has no tachycardia, tachypnea or hypoxia.  Nonfocal neuro exam without deficits.  Does have some shortness of breath with coughing otherwise no dyspnea, exertional or pleuritic chest pain.  Plan on labs, imaging and reassess  Labs and imaging personally reviewed and interpreted:  CBC without leukocytosis Metabolic panel with mild hypokalemia at 3.3, no additional electrolyte, renal or liver abnormality Pregnancy test negative Troponin < 2 D-dimer <0.27 EKG without ischemic changes Chest x-ray without infiltrates, cardiomegaly, pulmonary edema, pneumothorax  Patient reassessed.  Discussed labs and imaging.  Low suspicion for acute ACS, PE, dissection, bacterial infectious process.  She is tolerating p.o. intake here without difficulty.  She has no hypoxia with ambulation.  Discussed Paxlovid treatment.  Discussed risk versus benefit.  Will DC with Rx outpatient as well as symptomatic treatment.  She will return for any worsening symptoms otherwise follow-up with PCP.  The patient has been appropriately medically screened and/or stabilized in the ED. I have low suspicion for any other emergent medical condition which would require further screening, evaluation or treatment in the ED or require inpatient management.  Patient is hemodynamically stable and in no acute distress.  Patient able to ambulate in department prior to ED.  Evaluation does not show acute pathology that would require ongoing or additional emergent interventions while in the emergency department or further inpatient treatment.  I have discussed the diagnosis with the patient and answered all questions.  Pain is been managed while in the emergency department and patient has no further complaints prior to discharge.  Patient is comfortable with plan discussed in room and  is  stable for discharge at this time.  I have discussed strict return precautions for returning to the emergency department.  Patient was encouraged to follow-up with PCP/specialist refer to at discharge.    MDM Rules/Calculators/A&P                           Final Clinical Impression(s) / ED Diagnoses Final diagnoses:  COVID  Cough    Rx / DC Orders ED Discharge Orders          Ordered    nirmatrelvir/ritonavir EUA (PAXLOVID) TABS  2 times daily        09/17/20 2338    albuterol (VENTOLIN HFA) 108 (90 Base) MCG/ACT inhaler  Every 6 hours PRN        09/17/20 2338    benzonatate (TESSALON) 100 MG capsule  Every 8 hours        09/17/20 2338    fluticasone (FLONASE) 50 MCG/ACT nasal spray  Daily        09/17/20 2338             [place] Yepiz A, PA-C 09/17/20 2345    Benjiman Core, MD 09/17/20 2353

## 2020-09-17 NOTE — ED Triage Notes (Signed)
Covid positive yesterday. S/s since Monday. Admits to ED today with shortness of breath and some chest pain.

## 2020-09-18 ENCOUNTER — Other Ambulatory Visit (HOSPITAL_BASED_OUTPATIENT_CLINIC_OR_DEPARTMENT_OTHER): Payer: Self-pay

## 2021-01-12 DIAGNOSIS — F411 Generalized anxiety disorder: Secondary | ICD-10-CM | POA: Diagnosis not present

## 2021-01-12 DIAGNOSIS — F331 Major depressive disorder, recurrent, moderate: Secondary | ICD-10-CM | POA: Diagnosis not present

## 2021-01-12 DIAGNOSIS — F4010 Social phobia, unspecified: Secondary | ICD-10-CM | POA: Diagnosis not present

## 2021-02-01 DIAGNOSIS — F331 Major depressive disorder, recurrent, moderate: Secondary | ICD-10-CM | POA: Diagnosis not present

## 2021-02-01 DIAGNOSIS — F4010 Social phobia, unspecified: Secondary | ICD-10-CM | POA: Diagnosis not present

## 2021-02-01 DIAGNOSIS — F411 Generalized anxiety disorder: Secondary | ICD-10-CM | POA: Diagnosis not present

## 2021-02-10 DIAGNOSIS — F411 Generalized anxiety disorder: Secondary | ICD-10-CM | POA: Diagnosis not present

## 2021-02-10 DIAGNOSIS — F331 Major depressive disorder, recurrent, moderate: Secondary | ICD-10-CM | POA: Diagnosis not present

## 2021-02-10 DIAGNOSIS — F4010 Social phobia, unspecified: Secondary | ICD-10-CM | POA: Diagnosis not present

## 2021-02-22 DIAGNOSIS — F4010 Social phobia, unspecified: Secondary | ICD-10-CM | POA: Diagnosis not present

## 2021-02-22 DIAGNOSIS — F411 Generalized anxiety disorder: Secondary | ICD-10-CM | POA: Diagnosis not present

## 2021-02-22 DIAGNOSIS — F331 Major depressive disorder, recurrent, moderate: Secondary | ICD-10-CM | POA: Diagnosis not present

## 2021-03-16 DIAGNOSIS — F419 Anxiety disorder, unspecified: Secondary | ICD-10-CM | POA: Diagnosis not present

## 2021-03-16 DIAGNOSIS — F331 Major depressive disorder, recurrent, moderate: Secondary | ICD-10-CM | POA: Diagnosis not present

## 2021-03-16 DIAGNOSIS — F900 Attention-deficit hyperactivity disorder, predominantly inattentive type: Secondary | ICD-10-CM | POA: Diagnosis not present

## 2021-06-09 DIAGNOSIS — F419 Anxiety disorder, unspecified: Secondary | ICD-10-CM | POA: Diagnosis not present

## 2021-06-09 DIAGNOSIS — F900 Attention-deficit hyperactivity disorder, predominantly inattentive type: Secondary | ICD-10-CM | POA: Diagnosis not present

## 2021-06-09 DIAGNOSIS — F331 Major depressive disorder, recurrent, moderate: Secondary | ICD-10-CM | POA: Diagnosis not present

## 2021-06-21 DIAGNOSIS — L03032 Cellulitis of left toe: Secondary | ICD-10-CM | POA: Diagnosis not present

## 2021-06-21 DIAGNOSIS — B351 Tinea unguium: Secondary | ICD-10-CM | POA: Diagnosis not present

## 2021-06-28 DIAGNOSIS — B351 Tinea unguium: Secondary | ICD-10-CM | POA: Diagnosis not present

## 2021-06-28 DIAGNOSIS — Z0000 Encounter for general adult medical examination without abnormal findings: Secondary | ICD-10-CM | POA: Diagnosis not present

## 2021-09-30 DIAGNOSIS — F900 Attention-deficit hyperactivity disorder, predominantly inattentive type: Secondary | ICD-10-CM | POA: Diagnosis not present

## 2021-09-30 DIAGNOSIS — F331 Major depressive disorder, recurrent, moderate: Secondary | ICD-10-CM | POA: Diagnosis not present

## 2021-09-30 DIAGNOSIS — F419 Anxiety disorder, unspecified: Secondary | ICD-10-CM | POA: Diagnosis not present

## 2021-10-15 DIAGNOSIS — F4010 Social phobia, unspecified: Secondary | ICD-10-CM | POA: Diagnosis not present

## 2021-10-15 DIAGNOSIS — F331 Major depressive disorder, recurrent, moderate: Secondary | ICD-10-CM | POA: Diagnosis not present

## 2021-10-15 DIAGNOSIS — F411 Generalized anxiety disorder: Secondary | ICD-10-CM | POA: Diagnosis not present

## 2021-10-22 DIAGNOSIS — F411 Generalized anxiety disorder: Secondary | ICD-10-CM | POA: Diagnosis not present

## 2021-10-22 DIAGNOSIS — F4010 Social phobia, unspecified: Secondary | ICD-10-CM | POA: Diagnosis not present

## 2021-10-22 DIAGNOSIS — F331 Major depressive disorder, recurrent, moderate: Secondary | ICD-10-CM | POA: Diagnosis not present

## 2021-10-29 DIAGNOSIS — F411 Generalized anxiety disorder: Secondary | ICD-10-CM | POA: Diagnosis not present

## 2021-10-29 DIAGNOSIS — F4010 Social phobia, unspecified: Secondary | ICD-10-CM | POA: Diagnosis not present

## 2021-10-29 DIAGNOSIS — F331 Major depressive disorder, recurrent, moderate: Secondary | ICD-10-CM | POA: Diagnosis not present

## 2021-12-16 DIAGNOSIS — F331 Major depressive disorder, recurrent, moderate: Secondary | ICD-10-CM | POA: Diagnosis not present

## 2021-12-16 DIAGNOSIS — F900 Attention-deficit hyperactivity disorder, predominantly inattentive type: Secondary | ICD-10-CM | POA: Diagnosis not present

## 2021-12-16 DIAGNOSIS — F419 Anxiety disorder, unspecified: Secondary | ICD-10-CM | POA: Diagnosis not present

## 2021-12-21 DIAGNOSIS — R7309 Other abnormal glucose: Secondary | ICD-10-CM | POA: Diagnosis not present

## 2021-12-21 DIAGNOSIS — Z23 Encounter for immunization: Secondary | ICD-10-CM | POA: Diagnosis not present

## 2021-12-21 DIAGNOSIS — Z79899 Other long term (current) drug therapy: Secondary | ICD-10-CM | POA: Diagnosis not present

## 2021-12-21 DIAGNOSIS — D539 Nutritional anemia, unspecified: Secondary | ICD-10-CM | POA: Diagnosis not present

## 2021-12-21 DIAGNOSIS — R519 Headache, unspecified: Secondary | ICD-10-CM | POA: Diagnosis not present

## 2021-12-21 DIAGNOSIS — F5101 Primary insomnia: Secondary | ICD-10-CM | POA: Diagnosis not present

## 2022-01-06 DIAGNOSIS — F900 Attention-deficit hyperactivity disorder, predominantly inattentive type: Secondary | ICD-10-CM | POA: Diagnosis not present

## 2022-01-06 DIAGNOSIS — F419 Anxiety disorder, unspecified: Secondary | ICD-10-CM | POA: Diagnosis not present

## 2022-01-06 DIAGNOSIS — F331 Major depressive disorder, recurrent, moderate: Secondary | ICD-10-CM | POA: Diagnosis not present

## 2022-03-17 DIAGNOSIS — G4719 Other hypersomnia: Secondary | ICD-10-CM | POA: Diagnosis not present

## 2022-03-17 DIAGNOSIS — G43101 Migraine with aura, not intractable, with status migrainosus: Secondary | ICD-10-CM | POA: Diagnosis not present

## 2022-03-17 DIAGNOSIS — R0683 Snoring: Secondary | ICD-10-CM | POA: Diagnosis not present

## 2022-03-21 DIAGNOSIS — D508 Other iron deficiency anemias: Secondary | ICD-10-CM | POA: Diagnosis not present

## 2022-03-21 DIAGNOSIS — G4700 Insomnia, unspecified: Secondary | ICD-10-CM | POA: Diagnosis not present

## 2022-03-24 DIAGNOSIS — F900 Attention-deficit hyperactivity disorder, predominantly inattentive type: Secondary | ICD-10-CM | POA: Diagnosis not present

## 2022-03-24 DIAGNOSIS — F419 Anxiety disorder, unspecified: Secondary | ICD-10-CM | POA: Diagnosis not present

## 2022-03-24 DIAGNOSIS — F331 Major depressive disorder, recurrent, moderate: Secondary | ICD-10-CM | POA: Diagnosis not present

## 2022-04-05 DIAGNOSIS — Z01419 Encounter for gynecological examination (general) (routine) without abnormal findings: Secondary | ICD-10-CM | POA: Diagnosis not present

## 2022-04-05 DIAGNOSIS — L680 Hirsutism: Secondary | ICD-10-CM | POA: Diagnosis not present

## 2022-04-05 DIAGNOSIS — Z1231 Encounter for screening mammogram for malignant neoplasm of breast: Secondary | ICD-10-CM | POA: Diagnosis not present

## 2022-04-05 DIAGNOSIS — Z6824 Body mass index (BMI) 24.0-24.9, adult: Secondary | ICD-10-CM | POA: Diagnosis not present

## 2022-04-20 DIAGNOSIS — D509 Iron deficiency anemia, unspecified: Secondary | ICD-10-CM | POA: Diagnosis not present

## 2022-04-20 DIAGNOSIS — R55 Syncope and collapse: Secondary | ICD-10-CM | POA: Diagnosis not present

## 2022-04-20 DIAGNOSIS — K900 Celiac disease: Secondary | ICD-10-CM | POA: Diagnosis not present

## 2022-04-22 ENCOUNTER — Other Ambulatory Visit: Payer: Self-pay | Admitting: Family

## 2022-04-22 ENCOUNTER — Inpatient Hospital Stay: Payer: BC Managed Care – PPO | Attending: Hematology & Oncology

## 2022-04-22 ENCOUNTER — Other Ambulatory Visit: Payer: Self-pay

## 2022-04-22 ENCOUNTER — Inpatient Hospital Stay (HOSPITAL_BASED_OUTPATIENT_CLINIC_OR_DEPARTMENT_OTHER): Payer: BC Managed Care – PPO | Admitting: Family

## 2022-04-22 ENCOUNTER — Encounter: Payer: Self-pay | Admitting: Family

## 2022-04-22 VITALS — BP 101/73 | HR 85 | Temp 98.0°F | Resp 18 | Ht 65.0 in | Wt 144.0 lb

## 2022-04-22 DIAGNOSIS — K589 Irritable bowel syndrome without diarrhea: Secondary | ICD-10-CM | POA: Insufficient documentation

## 2022-04-22 DIAGNOSIS — N920 Excessive and frequent menstruation with regular cycle: Secondary | ICD-10-CM | POA: Diagnosis not present

## 2022-04-22 DIAGNOSIS — K900 Celiac disease: Secondary | ICD-10-CM

## 2022-04-22 DIAGNOSIS — K909 Intestinal malabsorption, unspecified: Secondary | ICD-10-CM | POA: Diagnosis not present

## 2022-04-22 DIAGNOSIS — Z8719 Personal history of other diseases of the digestive system: Secondary | ICD-10-CM | POA: Insufficient documentation

## 2022-04-22 DIAGNOSIS — D500 Iron deficiency anemia secondary to blood loss (chronic): Secondary | ICD-10-CM

## 2022-04-22 DIAGNOSIS — Z803 Family history of malignant neoplasm of breast: Secondary | ICD-10-CM

## 2022-04-22 DIAGNOSIS — D649 Anemia, unspecified: Secondary | ICD-10-CM

## 2022-04-22 DIAGNOSIS — D509 Iron deficiency anemia, unspecified: Secondary | ICD-10-CM

## 2022-04-22 LAB — CBC WITH DIFFERENTIAL (CANCER CENTER ONLY)
Abs Immature Granulocytes: 0.01 10*3/uL (ref 0.00–0.07)
Basophils Absolute: 0.1 10*3/uL (ref 0.0–0.1)
Basophils Relative: 1 %
Eosinophils Absolute: 0.1 10*3/uL (ref 0.0–0.5)
Eosinophils Relative: 1 %
HCT: 36.5 % (ref 36.0–46.0)
Hemoglobin: 11 g/dL — ABNORMAL LOW (ref 12.0–15.0)
Immature Granulocytes: 0 %
Lymphocytes Relative: 23 %
Lymphs Abs: 1.4 10*3/uL (ref 0.7–4.0)
MCH: 22.4 pg — ABNORMAL LOW (ref 26.0–34.0)
MCHC: 30.1 g/dL (ref 30.0–36.0)
MCV: 74.5 fL — ABNORMAL LOW (ref 80.0–100.0)
Monocytes Absolute: 0.3 10*3/uL (ref 0.1–1.0)
Monocytes Relative: 5 %
Neutro Abs: 4.4 10*3/uL (ref 1.7–7.7)
Neutrophils Relative %: 70 %
Platelet Count: 442 10*3/uL — ABNORMAL HIGH (ref 150–400)
RBC: 4.9 MIL/uL (ref 3.87–5.11)
RDW: 17 % — ABNORMAL HIGH (ref 11.5–15.5)
WBC Count: 6.3 10*3/uL (ref 4.0–10.5)
nRBC: 0 % (ref 0.0–0.2)

## 2022-04-22 LAB — CMP (CANCER CENTER ONLY)
ALT: 19 U/L (ref 0–44)
AST: 17 U/L (ref 15–41)
Albumin: 4.8 g/dL (ref 3.5–5.0)
Alkaline Phosphatase: 71 U/L (ref 38–126)
Anion gap: 11 (ref 5–15)
BUN: 13 mg/dL (ref 6–20)
CO2: 20 mmol/L — ABNORMAL LOW (ref 22–32)
Calcium: 9.4 mg/dL (ref 8.9–10.3)
Chloride: 107 mmol/L (ref 98–111)
Creatinine: 0.93 mg/dL (ref 0.44–1.00)
GFR, Estimated: >60 mL/min (ref >60)
Glucose, Bld: 113 mg/dL — ABNORMAL HIGH (ref 70–99)
Potassium: 3.6 mmol/L (ref 3.5–5.1)
Sodium: 138 mmol/L (ref 135–145)
Total Bilirubin: 0.4 mg/dL (ref 0.3–1.2)
Total Protein: 7.8 g/dL (ref 6.5–8.1)

## 2022-04-22 LAB — RETICULOCYTES
Immature Retic Fract: 13.5 % (ref 2.3–15.9)
RBC.: 4.91 MIL/uL (ref 3.87–5.11)
Retic Count, Absolute: 46.6 10*3/uL (ref 19.0–186.0)
Retic Ct Pct: 1 % (ref 0.4–3.1)

## 2022-04-22 LAB — FERRITIN: Ferritin: 4 ng/mL — ABNORMAL LOW (ref 11–307)

## 2022-04-22 LAB — LACTATE DEHYDROGENASE: LDH: 124 U/L (ref 98–192)

## 2022-04-22 NOTE — Progress Notes (Signed)
Hematology/Oncology Consultation   Name: April Moon      MRN: [id number]    Location: Room/bed info not found  Date: 04/22/2022 Time:3:02 PM   REFERRING PHYSICIAN:  Charna Archer, MD  REASON FOR CONSULT: Iron deficiency anemia    DIAGNOSIS: Iron deficiency anemia secondary to cycle and malabsorption with celiac disease  HISTORY OF PRESENT ILLNESS: April Moon is a very pleasant 42 yo caucasian female with iron deficiency anemia.  Iron level was low at 18 last month. Today's iron studies are pending.   She is symptomatic with fatigue, weakness, brain fog, SOB with exertion, palpitations, dizziness, numbness and tingling in her hands and feet and chills.  He cycle is regular with normal flow and she has noted clots at time. No other blood loss noted. No bruising or petechiae.  She has had a tonsillectomy and wisdom teeth removed without any issues.  She has history of celiac disease diagnosed in 2007 and IBS. She states that her last EGD and colonoscopy were 7 years ago while living in [place]. She states that she has history of benign colonic polyps.   No personal history of cancer. She had a maternal aunt with breast, maternal aunt with colon and paternal grandmother with history of breast and stomach cancer.  She states that she recently had her annual mammogram and result was negative.  No history of diabetes and thyroid disease.  No fever, n/v,  cough, rash, chest pain, abdominal pain or changes in bowel or bladder habits.  No swelling or tenderness in her extremities.  No falls or syncope reported.  No smoking, ETOH socially. Occasional use of gummies.  Appetite and hydration are good. Weight is stable at 144 lbs.  She stays busy with her children and also works as a Conservation officer, nature for [employer].    ROS: All other 10 point review of systems is negative.   PAST MEDICAL HISTORY:   Past Medical History:  Diagnosis Date   ADD (attention deficit disorder)    Allergy    Anxiety     Carpal tunnel syndrome, right    Celiac disease    Chicken pox    Depression    prozac   Hirsutism    Migraine     ALLERGIES: Allergies  Allergen Reactions   Sulfa Antibiotics Hives, Nausea And Vomiting and Other (See Comments)    Dizziness   Chicken Allergy Other (See Comments)    GI intolerance   Eggs Or Egg-Derived Products Other (See Comments)    GI intolerance   Gluten Meal Other (See Comments)    GI intolerance      MEDICATIONS:  Current Outpatient Medications on File Prior to Visit  Medication Sig Dispense Refill   albuterol (VENTOLIN HFA) 108 (90 Base) MCG/ACT inhaler Inhale 1-2 puffs into the lungs every 6 (six) hours as needed for wheezing or shortness of breath. 8.5 g 0   benzonatate (TESSALON) 100 MG capsule Take 1 capsule (100 mg total) by mouth every 8 (eight) hours. 21 capsule 0   fluticasone (FLONASE) 50 MCG/ACT nasal spray Place 2 sprays into both nostrils daily. 16 g 0   pantoprazole (PROTONIX) 40 MG tablet Take 1 tablet (40 mg total) by mouth daily. 14 tablet 0   Prenatal Vit-Fe Fumarate-FA (PRENATAL MULTIVITAMIN) TABS tablet Take 1 tablet by mouth daily at 12 noon.     [DISCONTINUED] FLUoxetine (PROZAC) 40 MG capsule Take 1 capsule by mouth daily 30 capsule 1   No current facility-administered medications on  file prior to visit.     PAST SURGICAL HISTORY Past Surgical History:  Procedure Laterality Date   LASIK  2006   TONSILLECTOMY     VAGINAL DELIVERY      FAMILY HISTORY: Family History  Problem Relation Age of Onset   Breast cancer Paternal Grandmother    Stomach cancer Paternal Grandmother    Ovarian cancer Paternal Grandmother    Autoimmune disease Mother        Langerhan's cell histiocytosis   Barrett's esophagus Father    Breast cancer Maternal Grandmother    Breast cancer Maternal Aunt     SOCIAL HISTORY:  reports that she has never smoked. She has never used smokeless tobacco. She reports current alcohol use. She reports that  she does not use drugs.  PERFORMANCE STATUS: The patient's performance status is 1 - Symptomatic but completely ambulatory  PHYSICAL EXAM: Most Recent Vital Signs: currently breastfeeding. There were no vitals taken for this visit.  General Appearance:    Alert, cooperative, no distress, appears stated age  Head:    Normocephalic, without obvious abnormality, atraumatic  Eyes:    PERRL, conjunctiva/corneas clear, EOM's intact, fundi    benign, both eyes        Throat:   Lips, mucosa, and tongue normal; teeth and gums normal  Neck:   Supple, symmetrical, trachea midline, no adenopathy;    thyroid:  no enlargement/tenderness/nodules; no carotid   bruit or JVD  Back:     Symmetric, no curvature, ROM normal, no CVA tenderness  Lungs:     Clear to auscultation bilaterally, respirations unlabored  Chest Wall:    No tenderness or deformity   Heart:    Regular rate and rhythm, S1 and S2 normal, no murmur, rub   or gallop     Abdomen:     Soft, non-tender, bowel sounds active all four quadrants,    no masses, no organomegaly        Extremities:   Extremities normal, atraumatic, no cyanosis or edema  Pulses:   2+ and symmetric all extremities  Skin:   Skin color, texture, turgor normal, no rashes or lesions  Lymph nodes:   Cervical, supraclavicular, and axillary nodes normal  Neurologic:   CNII-XII intact, normal strength, sensation and reflexes    throughout    LABORATORY DATA:  No results found for this or any previous visit (from the past 48 hour(s)).    RADIOGRAPHY: No results found.     PATHOLOGY: None   ASSESSMENT/PLAN: April Moon is a very pleasant 42 yo caucasian female with iron deficiency anemia secondary to cycle and malabsorption with celiac disease.  We will get her set up for 2 doses of IV iron.  Follow-up in 8 weeks.   All questions were answered. The patient knows to call the clinic with any problems, questions or concerns. We can certainly see the patient much  sooner if necessary.   Lottie Dawson, NP

## 2022-04-24 LAB — ERYTHROPOIETIN: Erythropoietin: 16.4 m[IU]/mL (ref 2.6–18.5)

## 2022-04-25 ENCOUNTER — Inpatient Hospital Stay: Payer: BC Managed Care – PPO

## 2022-04-25 VITALS — BP 118/60 | HR 83 | Temp 98.2°F | Resp 17

## 2022-04-25 DIAGNOSIS — N920 Excessive and frequent menstruation with regular cycle: Secondary | ICD-10-CM | POA: Diagnosis not present

## 2022-04-25 DIAGNOSIS — K900 Celiac disease: Secondary | ICD-10-CM | POA: Diagnosis not present

## 2022-04-25 DIAGNOSIS — D500 Iron deficiency anemia secondary to blood loss (chronic): Secondary | ICD-10-CM | POA: Diagnosis not present

## 2022-04-25 DIAGNOSIS — Z8719 Personal history of other diseases of the digestive system: Secondary | ICD-10-CM | POA: Diagnosis not present

## 2022-04-25 DIAGNOSIS — Z803 Family history of malignant neoplasm of breast: Secondary | ICD-10-CM | POA: Diagnosis not present

## 2022-04-25 DIAGNOSIS — K589 Irritable bowel syndrome without diarrhea: Secondary | ICD-10-CM | POA: Diagnosis not present

## 2022-04-25 DIAGNOSIS — D509 Iron deficiency anemia, unspecified: Secondary | ICD-10-CM

## 2022-04-25 DIAGNOSIS — K909 Intestinal malabsorption, unspecified: Secondary | ICD-10-CM | POA: Diagnosis not present

## 2022-04-25 LAB — IRON AND IRON BINDING CAPACITY (CC-WL,HP ONLY)
Iron: 25 ug/dL — ABNORMAL LOW (ref 28–170)
Saturation Ratios: 6 % — ABNORMAL LOW (ref 10.4–31.8)
TIBC: 438 ug/dL (ref 250–450)
UIBC: 413 ug/dL (ref 148–442)

## 2022-04-25 MED ORDER — SODIUM CHLORIDE 0.9 % IV SOLN: Freq: Once | INTRAVENOUS | Status: DC

## 2022-04-25 MED ORDER — SODIUM CHLORIDE 0.9 % IV SOLN: Freq: Once | INTRAVENOUS | Status: AC

## 2022-04-25 MED ORDER — SODIUM CHLORIDE 0.9 % IV SOLN
300.0000 mg | Freq: Once | INTRAVENOUS | Status: AC
  Administered 2022-04-25: 300 mg via INTRAVENOUS
  Filled 2022-04-25: qty 300

## 2022-04-25 MED ORDER — SODIUM CHLORIDE 0.9 % IV SOLN: 510.0000 mg | Freq: Once | INTRAVENOUS | Status: DC

## 2022-04-25 NOTE — Patient Instructions (Signed)

## 2022-04-26 ENCOUNTER — Other Ambulatory Visit: Payer: Self-pay | Admitting: Family

## 2022-04-26 IMAGING — MG DIGITAL DIAGNOSTIC BILAT W/ TOMO W/ CAD
8 series · 8 of 24 positions shown · non-contrast
Comparison: None.

CLINICAL DATA: 39-year-old presenting with constant diffuse OUTER
RIGHT breast pain for approximately 6 months. This is the patient's
initial baseline mammogram.

Family history of breast cancer in a maternal aunt, her maternal
grandmother and her paternal grandmother.
EXAM:
DIGITAL DIAGNOSTIC BILATERAL MAMMOGRAM WITH TOMO AND CAD

[R CC synth-2D]
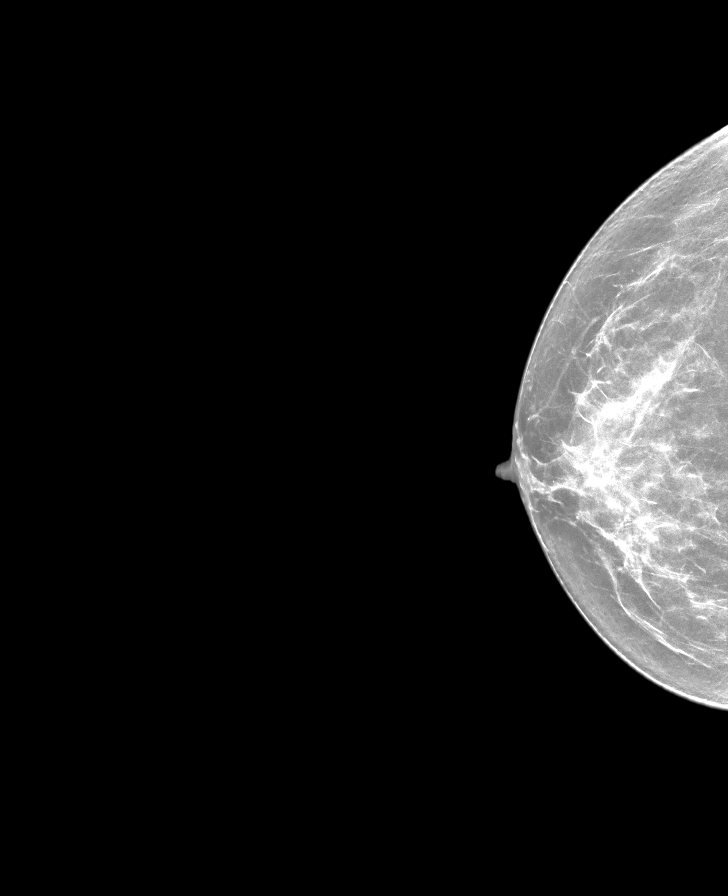

[R MLO synth-2D]
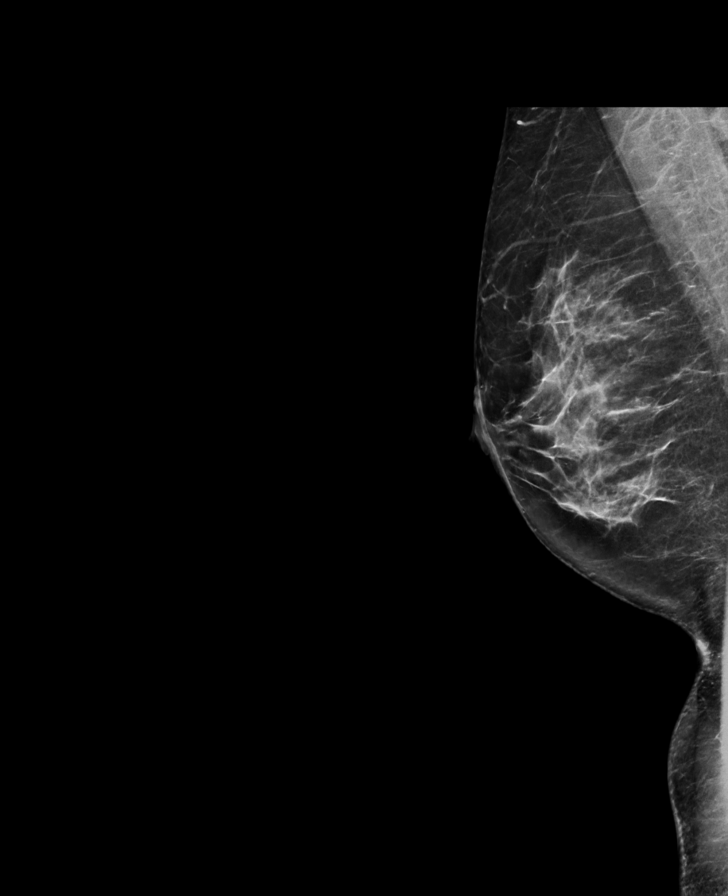

[L MLO synth-2D]
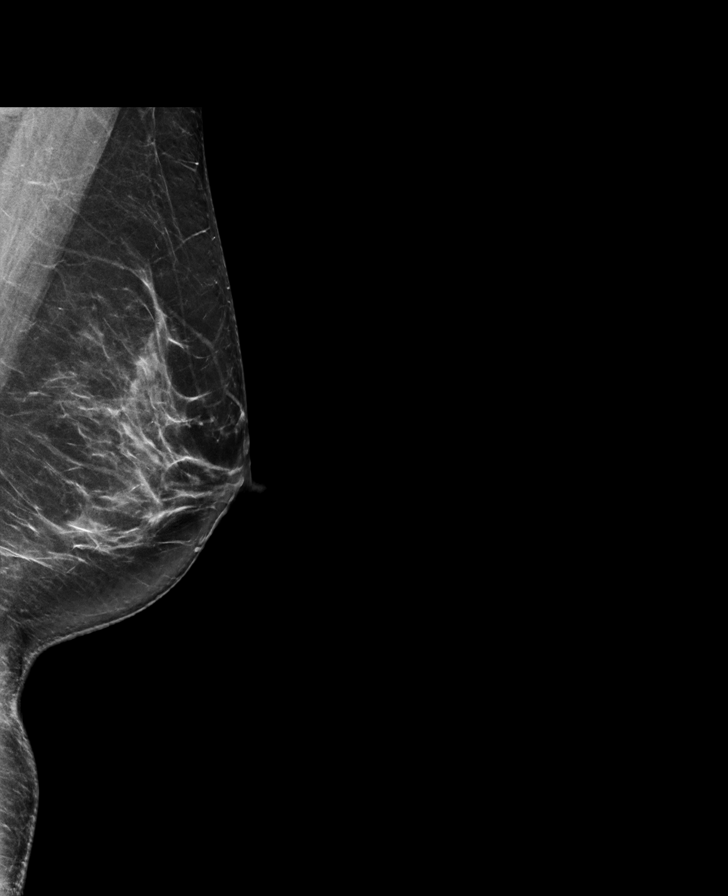

[L CC synth-2D]
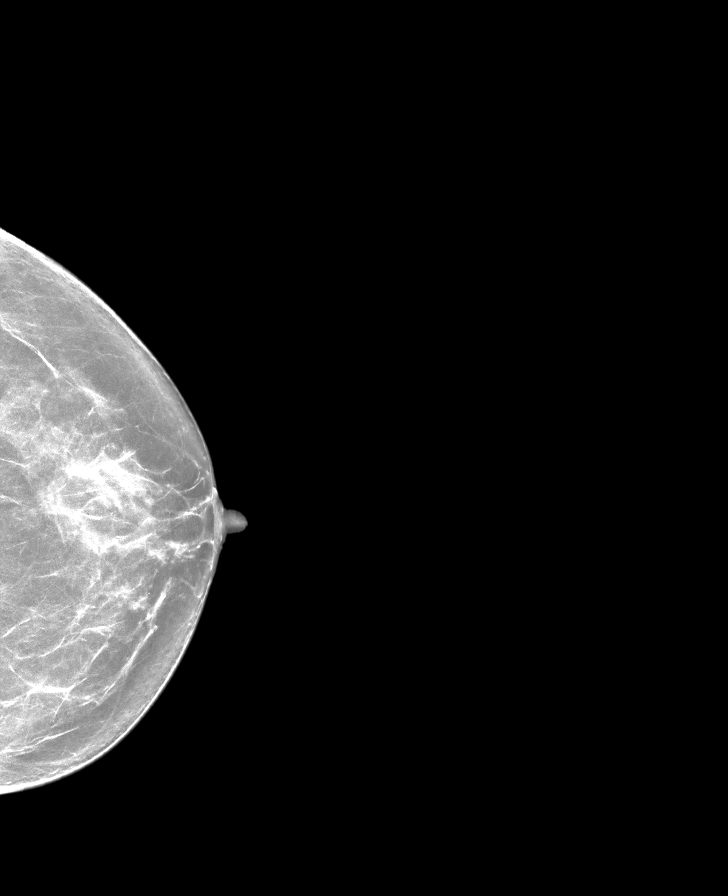

[L MLO tomo · tomo slice 36/71.0]
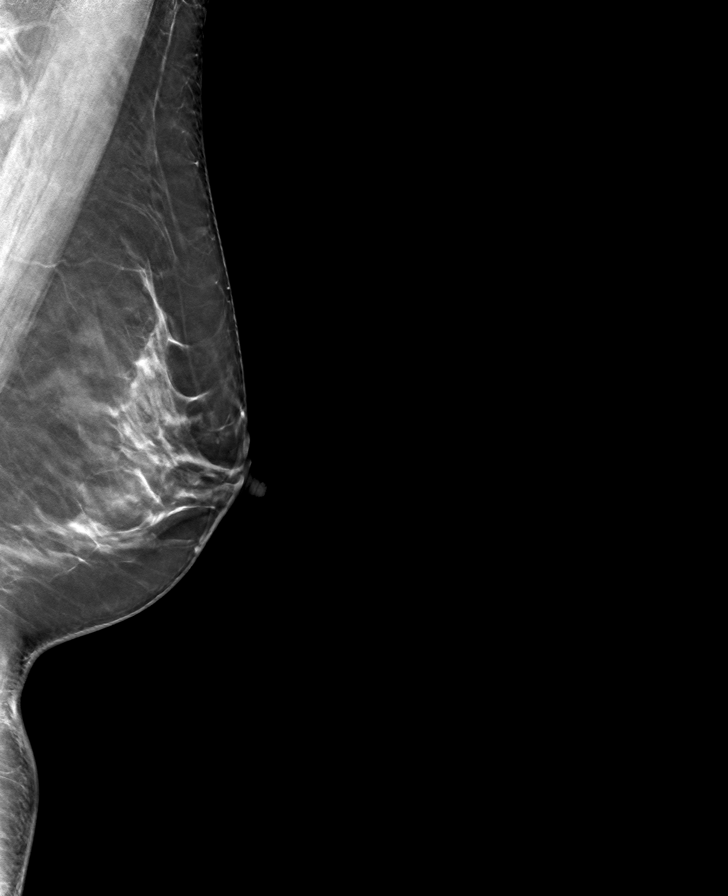

[R CC tomo · tomo slice 33/65.0]
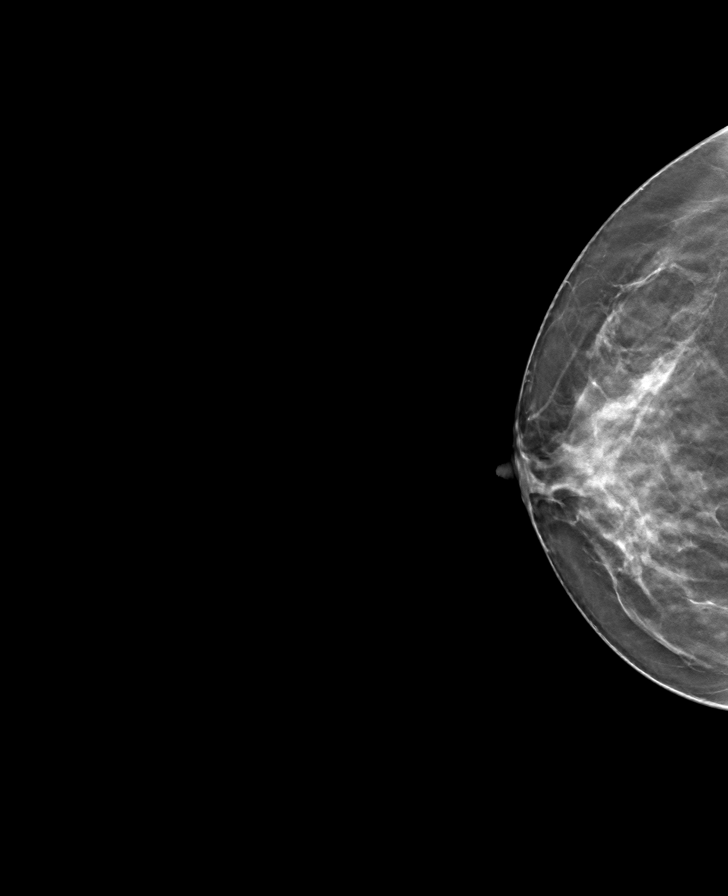

[R MLO tomo · tomo slice 35/70.0]
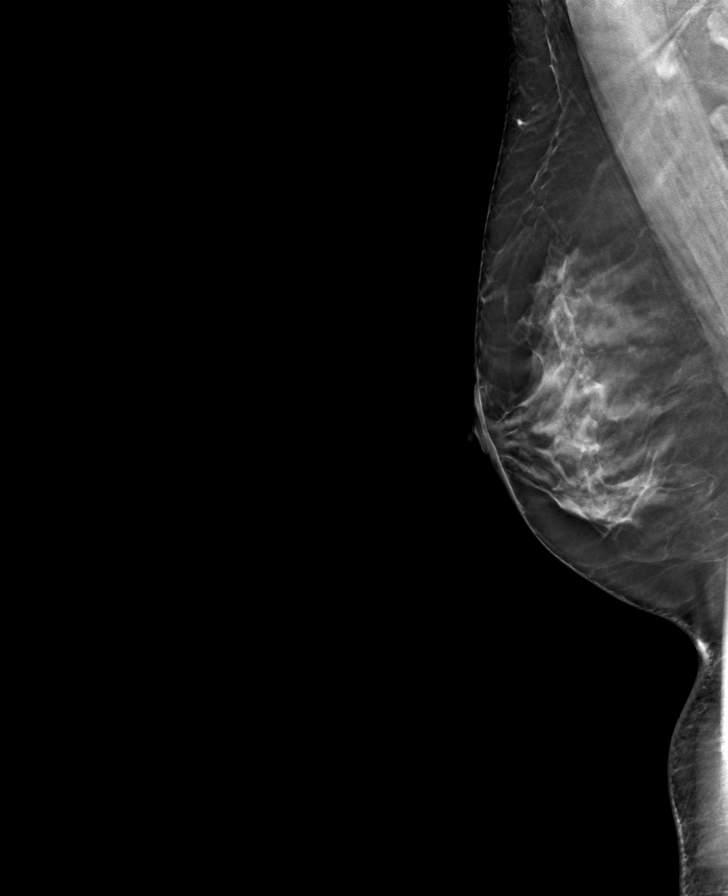

[L CC tomo · tomo slice 35/70.0]
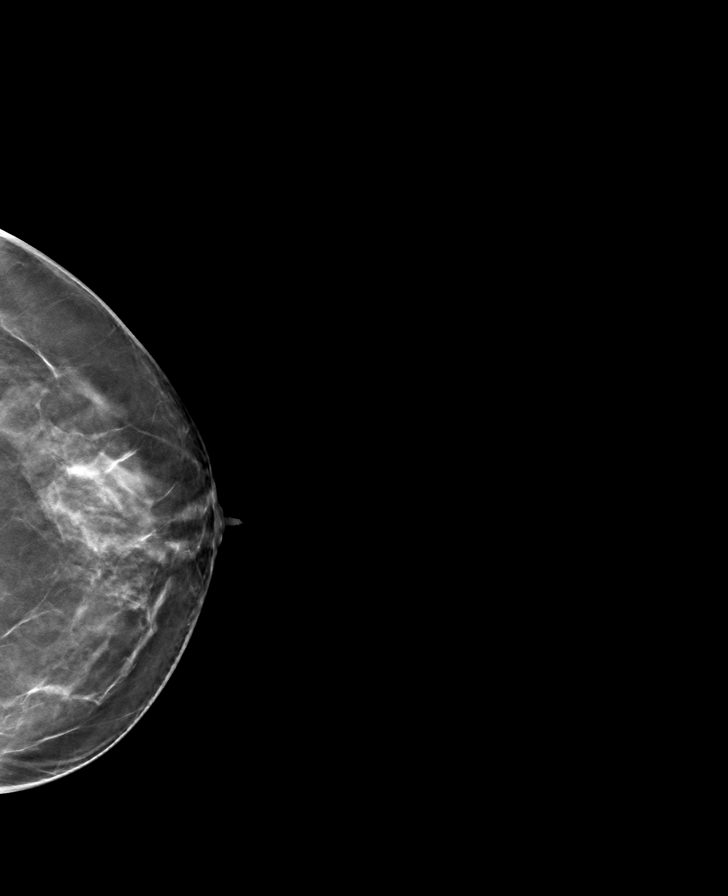

[8 of 24 positions shown; findings below may reference images not displayed]

ACR Breast Density Category c: The breast tissue is heterogeneously
dense, which may obscure small masses.
FINDINGS: Tomosynthesis and synthesized full field CC and MLO views of both
breasts were obtained.

No findings suspicious for malignancy in either breast.

Mammographic images were processed with CAD.
IMPRESSION: No mammographic evidence of malignancy involving either breast.

RECOMMENDATION:
Screening mammogram at age 41 unless there are persistent or
intervening clinical concerns. (Code:8E-7-OX3)

Strategies for alleviating breast pain including decreasing caffeine
intake, vitamin-E supplementation, and avoidance of tight
compression brassieres, including underwire brassieres, were
discussed with the patient.

I have discussed the findings and recommendations with the patient.
If applicable, a reminder letter will be sent to the patient
regarding the next appointment.

BI-RADS CATEGORY  1: Negative.

## 2022-05-02 ENCOUNTER — Inpatient Hospital Stay: Payer: BC Managed Care – PPO

## 2022-05-02 VITALS — BP 107/76 | HR 68 | Temp 98.2°F | Resp 17

## 2022-05-02 DIAGNOSIS — Z803 Family history of malignant neoplasm of breast: Secondary | ICD-10-CM | POA: Diagnosis not present

## 2022-05-02 DIAGNOSIS — K909 Intestinal malabsorption, unspecified: Secondary | ICD-10-CM | POA: Diagnosis not present

## 2022-05-02 DIAGNOSIS — K900 Celiac disease: Secondary | ICD-10-CM | POA: Diagnosis not present

## 2022-05-02 DIAGNOSIS — N920 Excessive and frequent menstruation with regular cycle: Secondary | ICD-10-CM | POA: Diagnosis not present

## 2022-05-02 DIAGNOSIS — Z8719 Personal history of other diseases of the digestive system: Secondary | ICD-10-CM | POA: Diagnosis not present

## 2022-05-02 DIAGNOSIS — D509 Iron deficiency anemia, unspecified: Secondary | ICD-10-CM

## 2022-05-02 DIAGNOSIS — D500 Iron deficiency anemia secondary to blood loss (chronic): Secondary | ICD-10-CM | POA: Diagnosis not present

## 2022-05-02 DIAGNOSIS — K589 Irritable bowel syndrome without diarrhea: Secondary | ICD-10-CM | POA: Diagnosis not present

## 2022-05-02 MED ORDER — SODIUM CHLORIDE 0.9 % IV SOLN
300.0000 mg | Freq: Once | INTRAVENOUS | Status: AC
  Administered 2022-05-02: 300 mg via INTRAVENOUS
  Filled 2022-05-02: qty 300

## 2022-05-02 MED ORDER — SODIUM CHLORIDE 0.9 % IV SOLN: Freq: Once | INTRAVENOUS | Status: AC

## 2022-05-27 ENCOUNTER — Encounter: Payer: Self-pay | Admitting: Hematology & Oncology

## 2022-06-01 ENCOUNTER — Ambulatory Visit: Payer: BC Managed Care – PPO | Admitting: Podiatry

## 2022-06-03 ENCOUNTER — Ambulatory Visit (INDEPENDENT_AMBULATORY_CARE_PROVIDER_SITE_OTHER): Payer: BC Managed Care – PPO | Admitting: Podiatry

## 2022-06-03 DIAGNOSIS — L600 Ingrowing nail: Secondary | ICD-10-CM | POA: Diagnosis not present

## 2022-06-03 NOTE — Patient Instructions (Signed)

## 2022-06-03 NOTE — Progress Notes (Signed)
   Chief Complaint  Patient presents with   Ingrown Toenail    Patient came in today for right foot hallux medial border ingrown toe nail, started a month ago, drainage and redness swelling,     Subjective: Patient presents today for evaluation of pain to the medial border right great toe. Patient is concerned for possible ingrown nail.  It is very sensitive to touch.  Patient presents today for further treatment and evaluation.  Past Medical History:  Diagnosis Date   ADD (attention deficit disorder)    Allergy    Anxiety    Carpal tunnel syndrome, right    Celiac disease    Chicken pox    Depression    prozac   Hirsutism    Migraine     Objective:  General: Well developed, nourished, in no acute distress, alert and oriented x3   Dermatology: Skin is warm, dry and supple bilateral.  Medial border right great toe is tender with evidence of an ingrowing nail. Pain on palpation noted to the border of the nail fold. The remaining nails appear unremarkable at this time. There are no open sores, lesions.  Vascular: DP and PT pulses palpable.  No clinical evidence of vascular compromise  Neruologic: Grossly intact via light touch bilateral.  Musculoskeletal: No pedal deformity noted  Assesement: #1 Paronychia with ingrowing nail medial border right great toe  Plan of Care:  1. Patient evaluated.  2. Discussed treatment alternatives and plan of care. Explained nail avulsion procedure and post procedure course to patient. 3. Patient opted for permanent partial nail avulsion of the ingrown portion of the nail.  4. Prior to procedure, local anesthesia infiltration utilized using 3 ml of a 50:50 mixture of 2% plain lidocaine and 0.5% plain marcaine in a normal hallux block fashion and a betadine prep performed.  5. Partial permanent nail avulsion with chemical matrixectomy performed using [id number] applications of phenol followed by alcohol flush.  6. Light dressing applied.  Post care  instructions provided 7.  Return to clinic 3 weeks  Edrick Kins, DPM [facility]  Dr. Edrick Kins, DPM    [address], [place] [address]                Office [phone]  Fax [phone]

## 2022-06-20 ENCOUNTER — Inpatient Hospital Stay: Payer: BC Managed Care – PPO | Attending: Hematology & Oncology

## 2022-06-20 ENCOUNTER — Encounter: Payer: Self-pay | Admitting: Family

## 2022-06-20 ENCOUNTER — Inpatient Hospital Stay (HOSPITAL_BASED_OUTPATIENT_CLINIC_OR_DEPARTMENT_OTHER): Payer: BC Managed Care – PPO | Admitting: Family

## 2022-06-20 VITALS — BP 100/65 | HR 68 | Temp 98.2°F | Resp 18 | Wt 140.1 lb

## 2022-06-20 DIAGNOSIS — D508 Other iron deficiency anemias: Secondary | ICD-10-CM | POA: Diagnosis not present

## 2022-06-20 DIAGNOSIS — D509 Iron deficiency anemia, unspecified: Secondary | ICD-10-CM | POA: Diagnosis not present

## 2022-06-20 DIAGNOSIS — K900 Celiac disease: Secondary | ICD-10-CM | POA: Diagnosis not present

## 2022-06-20 DIAGNOSIS — K909 Intestinal malabsorption, unspecified: Secondary | ICD-10-CM | POA: Diagnosis not present

## 2022-06-20 DIAGNOSIS — Z79899 Other long term (current) drug therapy: Secondary | ICD-10-CM | POA: Diagnosis not present

## 2022-06-20 LAB — CBC WITH DIFFERENTIAL (CANCER CENTER ONLY)
Abs Immature Granulocytes: 0.12 10*3/uL — ABNORMAL HIGH (ref 0.00–0.07)
Basophils Absolute: 0.1 10*3/uL (ref 0.0–0.1)
Basophils Relative: 1 %
Eosinophils Absolute: 0.3 10*3/uL (ref 0.0–0.5)
Eosinophils Relative: 3 %
HCT: 43.7 % (ref 36.0–46.0)
Hemoglobin: 13.8 g/dL (ref 12.0–15.0)
Immature Granulocytes: 1 %
Lymphocytes Relative: 22 %
Lymphs Abs: 2.1 10*3/uL (ref 0.7–4.0)
MCH: 26.3 pg (ref 26.0–34.0)
MCHC: 31.6 g/dL (ref 30.0–36.0)
MCV: 83.2 fL (ref 80.0–100.0)
Monocytes Absolute: 0.5 10*3/uL (ref 0.1–1.0)
Monocytes Relative: 5 %
Neutro Abs: 6.3 10*3/uL (ref 1.7–7.7)
Neutrophils Relative %: 68 %
Platelet Count: 353 10*3/uL (ref 150–400)
RBC: 5.25 MIL/uL — ABNORMAL HIGH (ref 3.87–5.11)
RDW: 20.6 % — ABNORMAL HIGH (ref 11.5–15.5)
WBC Count: 9.4 10*3/uL (ref 4.0–10.5)
nRBC: 0 % (ref 0.0–0.2)

## 2022-06-20 LAB — RETICULOCYTES
Immature Retic Fract: 10.3 % (ref 2.3–15.9)
RBC.: 5.16 MIL/uL — ABNORMAL HIGH (ref 3.87–5.11)
Retic Count, Absolute: 61.4 10*3/uL (ref 19.0–186.0)
Retic Ct Pct: 1.2 % (ref 0.4–3.1)

## 2022-06-20 LAB — FERRITIN: Ferritin: 21 ng/mL (ref 11–307)

## 2022-06-20 NOTE — Progress Notes (Signed)
Hematology and Oncology Follow Up Visit  April Moon [id number] 05-May-1980 42 y.o. 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 She is feeling much better since receiving IV iron. She still notes some fatigue and mild SOB with exertion at times.  Her cycle came 2 weeks early after receiving her 2nd dose of IV iron. No other blood loss noted.  No abnormal bruising, no petechiae.  She continues to have headaches and is seeing her neurologist in 2 weeks to discuss.  No fever, chills, n/v, cough, rash, dizziness, SOB, chest pain, palpitations, abdominal pain or changes in bowel or bladder habits.  No swelling in her extremities.  She has had tingling at times in her fingertips and most recently noted burning in her fingertips with washing her hands in warm water.  No falls or syncope reported.  Appetite and hydration ar good. Weight is stable at 140 lbs.   ECOG Performance Status: 1 - Symptomatic but completely ambulatory  Medications:  Allergies as of 06/20/2022       Reactions   Chicken Allergy Diarrhea, Nausea And Vomiting   Egg-derived Products Diarrhea, Nausea And Vomiting   Gluten Meal Diarrhea, Nausea And Vomiting   Sulfa Antibiotics Hives, Nausea And Vomiting, Other (See Comments)   Dizziness        Medication List        Accurate as of June 20, 2022  2:03 PM. If you have any questions, ask your nurse or doctor.          albuterol 108 (90 Base) MCG/ACT inhaler Commonly known as: VENTOLIN HFA Inhale 1-2 puffs into the lungs every 6 (six) hours as needed for wheezing or shortness of breath.   amphetamine-dextroamphetamine 10 MG 24 hr capsule Commonly known as: ADDERALL XR Take 10 mg by mouth daily.   amphetamine-dextroamphetamine 5 MG tablet Commonly known as: ADDERALL Take 5 mg by  mouth daily.   buPROPion 150 MG 24 hr tablet Commonly known as: WELLBUTRIN XL Take 150 mg by mouth daily.   FLUoxetine 20 MG capsule Commonly known as: PROZAC Take 40 mg by mouth daily.   LORazepam 0.5 MG tablet Commonly known as: ATIVAN Take 0.5 mg by mouth as directed.   rizatriptan 10 MG tablet Commonly known as: MAXALT Take 1 tablet by mouth as needed.   spironolactone 50 MG tablet Commonly known as: ALDACTONE Take 50 mg by mouth 2 (two) times daily.   topiramate 25 MG tablet Commonly known as: TOPAMAX Take 25 mg by mouth as directed.   traZODone 50 MG tablet Commonly known as: DESYREL Take 50 mg by mouth at bedtime as needed for sleep.        Allergies:  Allergies  Allergen Reactions   Chicken Allergy Diarrhea and Nausea And Vomiting   Egg-Derived Products Diarrhea and Nausea And Vomiting   Gluten Meal Diarrhea and Nausea And Vomiting   Sulfa Antibiotics Hives, Nausea And Vomiting and Other (See Comments)    Dizziness    Past Medical History, Surgical history, Social history, and Family History were reviewed and updated.  Review of Systems: All other 10 point review of systems is negative.   Physical Exam:  weight is 140 lb 1.9 oz (63.6 kg). Her oral temperature is 98.2 F (36.8 C). Her blood pressure is 100/65 and her pulse is  68. Her respiration is 18 and oxygen saturation is 99%.   Wt Readings from Last 3 Encounters:  06/20/22 140 lb 1.9 oz (63.6 kg)  04/22/22 144 lb (65.3 kg)  05/18/17 154 lb (69.9 kg)    Ocular: Sclerae unicteric, pupils equal, round and reactive to light Ear-nose-throat: Oropharynx clear, dentition fair Lymphatic: No cervical or supraclavicular adenopathy Lungs no rales or rhonchi, good excursion bilaterally Heart regular rate and rhythm, no murmur appreciated Abd soft, nontender, positive bowel sounds MSK no focal spinal tenderness, no joint edema Neuro: non-focal, well-oriented, appropriate affect Breasts: Deferred    Lab Results  Component Value Date   WBC 9.4 06/20/2022   HGB 13.8 06/20/2022   HCT 43.7 06/20/2022   MCV 83.2 06/20/2022   PLT 353 06/20/2022   Lab Results  Component Value Date   FERRITIN 4 (L) 04/22/2022   IRON 25 (L) 04/22/2022   TIBC 438 04/22/2022   UIBC 413 04/22/2022   IRONPCTSAT 6 (L) 04/22/2022   Lab Results  Component Value Date   RETICCTPCT 1.2 06/20/2022   RBC 5.16 (H) 06/20/2022   RBC 5.25 (H) 06/20/2022   No results found for: "KPAFRELGTCHN", "LAMBDASER", "KAPLAMBRATIO" No results found for: "IGGSERUM", "IGA", "IGMSERUM" No results found for: "TOTALPROTELP", "ALBUMINELP", "A1GS", "A2GS", "BETS", "BETA2SER", "GAMS", "MSPIKE", "SPEI"   Chemistry      Component Value Date/Time   NA 138 04/22/2022 1449   K 3.6 04/22/2022 1449   CL 107 04/22/2022 1449   CO2 20 (L) 04/22/2022 1449   BUN 13 04/22/2022 1449   CREATININE 0.93 04/22/2022 1449      Component Value Date/Time   CALCIUM 9.4 04/22/2022 1449   ALKPHOS 71 04/22/2022 1449   AST 17 04/22/2022 1449   ALT 19 04/22/2022 1449   BILITOT 0.4 04/22/2022 1449       Impression and Plan: April Moon is a very pleasant 42 yo caucasian female with iron deficiency anemia secondary to cycle and malabsorption with celiac disease.  Iron studies are pending.  Lab only in 6 weeks and follow-up in 3 months.   Eileen Stanford, NP 4/8/20242:03 PM

## 2022-06-21 LAB — IRON AND IRON BINDING CAPACITY (CC-WL,HP ONLY)
Iron: 69 ug/dL (ref 28–170)
Saturation Ratios: 19 % (ref 10.4–31.8)
TIBC: 372 ug/dL (ref 250–450)
UIBC: 303 ug/dL (ref 148–442)

## 2022-06-22 DIAGNOSIS — F331 Major depressive disorder, recurrent, moderate: Secondary | ICD-10-CM | POA: Diagnosis not present

## 2022-06-22 DIAGNOSIS — F419 Anxiety disorder, unspecified: Secondary | ICD-10-CM | POA: Diagnosis not present

## 2022-06-22 DIAGNOSIS — F900 Attention-deficit hyperactivity disorder, predominantly inattentive type: Secondary | ICD-10-CM | POA: Diagnosis not present

## 2022-06-27 ENCOUNTER — Ambulatory Visit (INDEPENDENT_AMBULATORY_CARE_PROVIDER_SITE_OTHER): Payer: BC Managed Care – PPO | Admitting: Podiatry

## 2022-06-27 ENCOUNTER — Encounter: Payer: Self-pay | Admitting: Podiatry

## 2022-06-27 DIAGNOSIS — L600 Ingrowing nail: Secondary | ICD-10-CM

## 2022-06-27 NOTE — Progress Notes (Signed)
   Chief Complaint  Patient presents with   Ingrown Toenail    Nail check - hallux right   "Its feels great"    Subjective: 42 y.o. female presents today status post permanent nail avulsion procedure of the medial border right great toe that was performed on 06/03/2022.  Patient doing well.  No longer has any pain or tenderness associated to the area.  She soaked her foot and applied antibiotic ointment as instructed.   Past Medical History:  Diagnosis Date   ADD (attention deficit disorder)    Allergy    Anxiety    Carpal tunnel syndrome, right    Celiac disease    Chicken pox    Depression    prozac   Hirsutism    Migraine     Objective: Neurovascular status intact.  Skin is warm, dry and supple. Nail and respective nail fold appears to be healing appropriately.   Assessment: #1 s/p partial permanent nail matrixectomy medial border right great toe.  06/03/2022   Plan of care: #1 patient was evaluated  #2 light debridement of the periungual debris was performed to the border of the respective toe and nail plate using a tissue nipper. #3 patient is to return to clinic on a PRN basis.   Felecia Shelling, DPM [facility]  Dr. Felecia Shelling, DPM    [address] [place], [place] [address]                Office [phone]  Fax [phone]

## 2022-06-28 DIAGNOSIS — G43109 Migraine with aura, not intractable, without status migrainosus: Secondary | ICD-10-CM | POA: Diagnosis not present

## 2022-06-29 ENCOUNTER — Ambulatory Visit
Admission: EM | Admit: 2022-06-29 | Discharge: 2022-06-29 | Disposition: A | Discharge status: Home / Self Care | Payer: BC Managed Care – PPO | Attending: Urgent Care | Admitting: Urgent Care

## 2022-06-29 ENCOUNTER — Encounter: Payer: Self-pay | Admitting: Nurse Practitioner

## 2022-06-29 ENCOUNTER — Ambulatory Visit (INDEPENDENT_AMBULATORY_CARE_PROVIDER_SITE_OTHER): Payer: BC Managed Care – PPO

## 2022-06-29 ENCOUNTER — Ambulatory Visit: Payer: BC Managed Care – PPO

## 2022-06-29 ENCOUNTER — Telehealth: Payer: BC Managed Care – PPO | Admitting: Nurse Practitioner

## 2022-06-29 DIAGNOSIS — Z1152 Encounter for screening for COVID-19: Secondary | ICD-10-CM | POA: Insufficient documentation

## 2022-06-29 DIAGNOSIS — R0602 Shortness of breath: Secondary | ICD-10-CM

## 2022-06-29 DIAGNOSIS — B349 Viral infection, unspecified: Secondary | ICD-10-CM | POA: Diagnosis not present

## 2022-06-29 DIAGNOSIS — J069 Acute upper respiratory infection, unspecified: Secondary | ICD-10-CM | POA: Diagnosis not present

## 2022-06-29 LAB — POCT INFLUENZA A/B
Influenza A, POC: NEGATIVE
Influenza B, POC: NEGATIVE

## 2022-06-29 MED ORDER — PREDNISONE 10 MG PO TABS: 30.0000 mg | ORAL_TABLET | Freq: Every day | ORAL | 0 refills | Status: DC

## 2022-06-29 MED ORDER — BENZONATATE 100 MG PO CAPS: 100.0000 mg | ORAL_CAPSULE | Freq: Three times a day (TID) | ORAL | 0 refills | Status: DC | PRN

## 2022-06-29 NOTE — Progress Notes (Signed)
Virtual Visit Consent   Lakitha Gordy, you are scheduled for a virtual visit with a [facility] provider today. Just as with appointments in the office, your consent must be obtained to participate. Your consent will be active for this visit and any virtual visit you may have with one of our providers in the next 365 days. If you have a MyChart account, a copy of this consent can be sent to you electronically.  As this is a virtual visit, video technology does not allow for your provider to perform a traditional examination. This may limit your provider's ability to fully assess your condition. If your provider identifies any concerns that need to be evaluated in person or the need to arrange testing (such as labs, EKG, etc.), we will make arrangements to do so. Although advances in technology are sophisticated, we cannot ensure that it will always work on either your end or our end. If the connection with a video visit is poor, the visit may have to be switched to a telephone visit. With either a video or telephone visit, we are not always able to ensure that we have a secure connection.  By engaging in this virtual visit, you consent to the provision of healthcare and authorize for your insurance to be billed (if applicable) for the services provided during this visit. Depending on your insurance coverage, you may receive a charge related to this service.  I need to obtain your verbal consent now. Are you willing to proceed with your visit today? Kambryn Dapolito has provided verbal consent on 06/29/2022 for a virtual visit (video or telephone). Viviano Simas, FNP  Date: 06/29/2022 4:20 PM  Virtual Visit via Video Note   I, Viviano Simas, connected with  Ava Tangney  ([id number], 1981/01/04) on 06/29/22 at  4:30 PM EDT by a video-enabled telemedicine application and verified that I am speaking with the correct person using two identifiers.  Location: Patient: Virtual Visit Location Patient:  Home Provider: Virtual Visit Location Provider: Home Office   I discussed the limitations of evaluation and management by telemedicine and the availability of in person appointments. The patient expressed understanding and agreed to proceed.    History of Present Illness: April Moon is a 42 y.o. who identifies as a female who was assigned female at birth, and is being seen today for advice on possible COVID infection She took a home COVID test that was expired and it was negative  She started coughing yesterday  100.5 temperature today  Headache onset today  She has shortness of breath and has barking cough  Chills   She had COVID in 2022 and feels similar  She did go to the ED at that time and took Paxlovid otherwise uncomplicated episode    She has been vaccinated for COVID without recent booster in the past year   She has a history of reactive airway without formal diagnosis  She does have an Albuterol inhaler that she has used in the past 24 hours without relief   Problems:  Patient Active Problem List   Diagnosis Date Noted   IDA (iron deficiency anemia) 04/22/2022   Advanced maternal age in multigravida, third trimester 05/18/2017   ADD (attention deficit disorder)    Hirsutism    Migraine    Anxiety    Celiac disease 01/01/2016   Depression 01/01/2016   Environmental allergies 01/01/2016    Allergies:  Allergies  Allergen Reactions   Chicken Allergy Diarrhea and Nausea And Vomiting  Egg-Derived Products Diarrhea and Nausea And Vomiting   Gluten Meal Diarrhea and Nausea And Vomiting   Sulfa Antibiotics Hives, Nausea And Vomiting and Other (See Comments)    Dizziness   Medications:  Current Outpatient Medications:    albuterol (VENTOLIN HFA) 108 (90 Base) MCG/ACT inhaler, Inhale 1-2 puffs into the lungs every 6 (six) hours as needed for wheezing or shortness of breath., Disp: 8.5 g, Rfl: 0   amphetamine-dextroamphetamine (ADDERALL XR) 10 MG 24 hr capsule,  Take 10 mg by mouth daily., Disp: , Rfl:    amphetamine-dextroamphetamine (ADDERALL) 5 MG tablet, Take 5 mg by mouth daily., Disp: , Rfl:    buPROPion (WELLBUTRIN XL) 150 MG 24 hr tablet, Take 150 mg by mouth daily., Disp: , Rfl:    FLUoxetine (PROZAC) 20 MG capsule, Take 40 mg by mouth daily., Disp: , Rfl:    LORazepam (ATIVAN) 0.5 MG tablet, Take 0.5 mg by mouth as directed., Disp: , Rfl:    rizatriptan (MAXALT) 10 MG tablet, Take 1 tablet by mouth as needed., Disp: , Rfl:    spironolactone (ALDACTONE) 50 MG tablet, Take 50 mg by mouth 2 (two) times daily., Disp: , Rfl:    topiramate (TOPAMAX) 25 MG tablet, Take 25 mg by mouth as directed., Disp: , Rfl:    traZODone (DESYREL) 50 MG tablet, Take 50 mg by mouth at bedtime as needed for sleep., Disp: , Rfl:   Observations/Objective: Patient is well-developed, well-nourished in no acute distress.  Resting comfortably  at home.  Head is normocephalic, atraumatic.  No labored breathing.  Speech is clear and coherent with logical content.  Patient is alert and oriented at baseline.  Barking cough present without acute distress   Assessment and Plan:  1. Viral upper respiratory tract infection Repeat home COVID testing if repeat test is negative recommended UC for URI testing including flu/RSV   If positive follow up with [facility] for anti-viral prescribing as discussed Patient agrees to plan        Follow Up Instructions: I discussed the assessment and treatment plan with the patient. The patient was provided an opportunity to ask questions and all were answered. The patient agreed with the plan and demonstrated an understanding of the instructions.  A copy of instructions were sent to the patient via MyChart unless otherwise noted below.    The patient was advised to call back or seek an in-person evaluation if the symptoms worsen or if the condition fails to improve as anticipated.  Time:  I spent 20 minutes with the patient via  telehealth technology discussing the above problems/concerns.    Viviano Simas, FNP

## 2022-06-29 NOTE — ED Provider Notes (Addendum)
[facility]  Note:  This document was prepared using Conservation officer, historic buildings and may include unintentional dictation errors.  MRN: [id number] DOB: 1980/04/10  Subjective:   April Moon is a 42 y.o. female presenting for 1 day history of acute onset coughing, dry barking cough, shob, fever (highest was 100.5 F), chills, shortness of breath, generalized headaches.  Has a history of migraines, was not relieved by taking her migraine medication.  Did a home COVID test and was negative.  Had a virtual visit done and was advised to come in.  Patient would like to be considered for testing for flu, COVID or RSV.  Has a history of recent respiratory infections, COVID, pneumonia.  Patient believes she has asthma but has never been officially diagnosed.  No smoking of any kind including cigarettes, cigars, vaping, marijuana use.    No current facility-administered medications for this encounter.  Current Outpatient Medications:    albuterol (VENTOLIN HFA) 108 (90 Base) MCG/ACT inhaler, Inhale 1-2 puffs into the lungs every 6 (six) hours as needed for wheezing or shortness of breath., Disp: 8.5 g, Rfl: 0   amphetamine-dextroamphetamine (ADDERALL XR) 10 MG 24 hr capsule, Take 10 mg by mouth daily., Disp: , Rfl:    amphetamine-dextroamphetamine (ADDERALL) 5 MG tablet, Take 5 mg by mouth daily., Disp: , Rfl:    buPROPion (WELLBUTRIN XL) 150 MG 24 hr tablet, Take 150 mg by mouth daily., Disp: , Rfl:    FLUoxetine (PROZAC) 20 MG capsule, Take 40 mg by mouth daily., Disp: , Rfl:    LORazepam (ATIVAN) 0.5 MG tablet, Take 0.5 mg by mouth as directed., Disp: , Rfl:    rizatriptan (MAXALT) 10 MG tablet, Take 1 tablet by mouth as needed., Disp: , Rfl:    spironolactone (ALDACTONE) 50 MG tablet, Take 50 mg by mouth 2 (two) times daily., Disp: , Rfl:    topiramate (TOPAMAX) 25 MG tablet, Take 25 mg by mouth as directed., Disp: , Rfl:    traZODone (DESYREL) 50 MG tablet, Take 50  mg by mouth at bedtime as needed for sleep., Disp: , Rfl:    Allergies  Allergen Reactions   Chicken Allergy Diarrhea and Nausea And Vomiting   Egg-Derived Products Diarrhea and Nausea And Vomiting   Gluten Meal Diarrhea and Nausea And Vomiting   Sulfa Antibiotics Hives, Nausea And Vomiting and Other (See Comments)    Dizziness    Past Medical History:  Diagnosis Date   ADD (attention deficit disorder)    Allergy    Anxiety    Carpal tunnel syndrome, right    Celiac disease    Chicken pox    Depression    prozac   Hirsutism    Migraine      Past Surgical History:  Procedure Laterality Date   LASIK  2006   TONSILLECTOMY     VAGINAL DELIVERY      Family History  Problem Relation Age of Onset   Breast cancer Paternal Grandmother    Stomach cancer Paternal Grandmother    Ovarian cancer Paternal Grandmother    Autoimmune disease Mother        Langerhan's cell histiocytosis   Barrett's esophagus Father    Breast cancer Maternal Grandmother    Breast cancer Maternal Aunt     Social History   Tobacco Use   Smoking status: Never   Smokeless tobacco: Never  Vaping Use   Vaping Use: Never used  Substance Use Topics   Alcohol  use: Yes   Drug use: No    ROS   Objective:   Vitals: BP 104/70 (BP Location: Right Arm)   Pulse 99   Temp 98.8 F (37.1 C) (Oral)   Resp 20   LMP 06/29/2022 (Exact Date)   SpO2 97%   Physical Exam Constitutional:      General: She is not in acute distress.    Appearance: Normal appearance. She is well-developed. She is not ill-appearing, toxic-appearing or diaphoretic.  HENT:     Head: Normocephalic and atraumatic.     Nose: Nose normal.     Mouth/Throat:     Mouth: Mucous membranes are moist.     Pharynx: No oropharyngeal exudate or posterior oropharyngeal erythema.  Eyes:     General: No scleral icterus.       Right eye: No discharge.        Left eye: No discharge.     Extraocular Movements: Extraocular movements intact.   Cardiovascular:     Rate and Rhythm: Normal rate and regular rhythm.     Heart sounds: Normal heart sounds. No murmur heard.    No friction rub. No gallop.  Pulmonary:     Effort: Pulmonary effort is normal. No respiratory distress.     Breath sounds: No stridor. No wheezing, rhonchi or rales.  Chest:     Chest wall: No tenderness.  Skin:    General: Skin is warm and dry.  Neurological:     General: No focal deficit present.     Mental Status: She is alert and oriented to person, place, and time.     Motor: No weakness.     Coordination: Coordination normal.     Gait: Gait normal.    DG Chest 2 View  Result Date: 06/29/2022 CLINICAL DATA:  shortness of breath EXAM: CHEST - 2 VIEW COMPARISON:  Chest x-ray 09/17/2020, CT chest 05/21/2017 FINDINGS: The heart and mediastinal contours are within normal limits. No focal consolidation. No pulmonary edema. No pleural effusion. No pneumothorax. No acute osseous abnormality. IMPRESSION: No active cardiopulmonary disease. Electronically Signed   By: Tish Frederickson M.D.   On: 06/29/2022 18:23    Results for orders placed or performed during the hospital encounter of 06/29/22 (from the past 24 hour(s))  POCT Influenza A/B     Status: None   Collection Time: 06/29/22  5:57 PM  Result Value Ref Range   Influenza A, POC Negative Negative   Influenza B, POC Negative Negative    Assessment and Plan :   PDMP not reviewed this encounter.  1. Acute viral syndrome   2. Shortness of breath     Patient had concerns about flu, covid, rsv as she was advised that she needed to be tested for these items through our clinic. I advised patient that the actual incidence is low currently but I would not be opposed to testing for these infections except RSV since we do not carry that test system wide currently. As patient had shortness of breath, also obtained a chest x-ray as a reassurance to the patient. Flu, chest x-ray were negative. Offered an oral  prednisone course to help patient's concerns of shortness of breath given concerns for asthma.  The patient would like to trial this.  Will avoid the use of albuterol given history of anxiety, lack of wheezing, hemodynamically stable vital signs, a normal pulse oximetry. Lastly, I do not suspect an acute encephalopathy given her history of migraines.  No need for CT  scan at this time.  Use supportive care. COVID testing pending. Follow up with PCP, neurologist, her specialists.  Counseled patient on potential for adverse effects with medications prescribed today, patient verbalized understanding.     Wallis Bamberg, [place] 06/29/22 1920

## 2022-06-29 NOTE — ED Triage Notes (Signed)
Pt reports cough yesterday, dry and barking. Today has had fever (Tmax 100.32f), HA, chills, and SOB. No relief of HA with her usual migraine medication. Home Covid test was negative, but test was expired. Had virtual UC visit and was advised to come in for testing.

## 2022-06-29 NOTE — Discharge Instructions (Addendum)
We will notify you of your test results as they arrive and may take between about 24 hours.  I encourage you to sign up for MyChart if you have not already done so as this can be the easiest way for us to communicate results to you online or through a phone app.  Generally, we only contact you if it is a positive test result.  In the meantime, if you develop worsening symptoms including fever, chest pain, shortness of breath despite our current treatment plan then please report to the emergency room as this may be a sign of worsening status from possible viral infection.  Otherwise, we will manage this as a viral syndrome. For sore throat or cough try using a honey-based tea. Use 3 teaspoons of honey with juice squeezed from half lemon. Place shaved pieces of ginger into 1/2-1 cup of water and warm over stove top. Then mix the ingredients and repeat every 4 hours as needed. Please take Tylenol 500mg-650mg every 6 hours for aches and pains, fevers. Hydrate very well with at least 2 liters of water. Eat light meals such as soups to replenish electrolytes and soft fruits, veggies. Start an antihistamine like Zyrtec (10mg daily) for postnasal drainage, sinus congestion.  You can take this together with prednisone.  Use the cough medications as needed.   

## 2022-06-30 LAB — SARS CORONAVIRUS 2 (TAT 6-24 HRS): SARS Coronavirus 2: NEGATIVE

## 2022-07-04 ENCOUNTER — Encounter: Payer: Self-pay | Admitting: Hematology & Oncology

## 2022-07-08 ENCOUNTER — Encounter: Payer: Self-pay | Admitting: Nurse Practitioner

## 2022-07-18 ENCOUNTER — Telehealth: Payer: BC Managed Care – PPO | Admitting: Physician Assistant

## 2022-07-18 DIAGNOSIS — R3989 Other symptoms and signs involving the genitourinary system: Secondary | ICD-10-CM

## 2022-07-18 MED ORDER — NITROFURANTOIN MONOHYD MACRO 100 MG PO CAPS
100.0000 mg | ORAL_CAPSULE | Freq: Two times a day (BID) | ORAL | 0 refills | Status: DC
Start: 2022-07-18 — End: 2022-12-23

## 2022-07-18 NOTE — Progress Notes (Signed)
Virtual Visit Consent   April Moon, you are scheduled for a virtual visit with a [facility] provider today. Just as with appointments in the office, your consent must be obtained to participate. Your consent will be active for this visit and any virtual visit you may have with one of our providers in the next 365 days. If you have a MyChart account, a copy of this consent can be sent to you electronically.  As this is a virtual visit, video technology does not allow for your provider to perform a traditional examination. This may limit your provider's ability to fully assess your condition. If your provider identifies any concerns that need to be evaluated in person or the need to arrange testing (such as labs, EKG, etc.), we will make arrangements to do so. Although advances in technology are sophisticated, we cannot ensure that it will always work on either your end or our end. If the connection with a video visit is poor, the visit may have to be switched to a telephone visit. With either a video or telephone visit, we are not always able to ensure that we have a secure connection.  By engaging in this virtual visit, you consent to the provision of healthcare and authorize for your insurance to be billed (if applicable) for the services provided during this visit. Depending on your insurance coverage, you may receive a charge related to this service.  I need to obtain your verbal consent now. Are you willing to proceed with your visit today? April Moon has provided verbal consent on 07/18/2022 for a virtual visit (video or telephone). [name], PA-C  Date: 07/18/2022 7:50 AM  Virtual Visit via Video Note   I, [name], connected with  April Moon  ([id number], 1980-10-17) on 07/18/22 at  7:45 AM EDT by a video-enabled telemedicine application and verified that I am speaking with the correct person using two identifiers.  Location: Patient: Virtual Visit Location  Patient: Home Provider: Virtual Visit Location Provider: Home Office   I discussed the limitations of evaluation and management by telemedicine and the availability of in person appointments. The patient expressed understanding and agreed to proceed.    History of Present Illness: April Moon is a 42 y.o. who identifies as a female who was assigned female at birth, and is being seen today for suspected UTI.  HPI: Urinary Tract Infection  This is a new problem. The current episode started yesterday. The problem occurs every urination. The problem has been gradually worsening. The quality of the pain is described as burning and aching. The pain is moderate. There has been no fever. Associated symptoms include frequency, hematuria, hesitancy and urgency. Pertinent negatives include no chills, discharge, flank pain, nausea or vomiting. Associated symptoms comments: Cloudy urine. She has tried increased fluids for the symptoms. The treatment provided no relief. There is no history of catheterization, recurrent UTIs or a urological procedure.     Problems:  Patient Active Problem List   Diagnosis Date Noted   IDA (iron deficiency anemia) 04/22/2022   Advanced maternal age in multigravida, third trimester 05/18/2017   ADD (attention deficit disorder)    Hirsutism    Migraine    Anxiety    Celiac disease 01/01/2016   Depression 01/01/2016   Environmental allergies 01/01/2016    Allergies:  Allergies  Allergen Reactions   Chicken Allergy Diarrhea and Nausea And Vomiting   Egg-Derived Products Diarrhea and Nausea And Vomiting   Gluten Meal Diarrhea and  Nausea And Vomiting   Sulfa Antibiotics Hives, Nausea And Vomiting and Other (See Comments)    Dizziness   Medications:  Current Outpatient Medications:    nitrofurantoin, macrocrystal-monohydrate, (MACROBID) 100 MG capsule, Take 1 capsule (100 mg total) by mouth 2 (two) times daily., Disp: 10 capsule, Rfl: 0   albuterol (VENTOLIN HFA)  108 (90 Base) MCG/ACT inhaler, Inhale 1-2 puffs into the lungs every 6 (six) hours as needed for wheezing or shortness of breath., Disp: 8.5 g, Rfl: 0   amphetamine-dextroamphetamine (ADDERALL XR) 10 MG 24 hr capsule, Take 10 mg by mouth daily., Disp: , Rfl:    amphetamine-dextroamphetamine (ADDERALL) 5 MG tablet, Take 5 mg by mouth daily., Disp: , Rfl:    benzonatate (TESSALON) 100 MG capsule, Take 1 capsule (100 mg total) by mouth 3 (three) times daily as needed for cough., Disp: 30 capsule, Rfl: 0   buPROPion (WELLBUTRIN XL) 150 MG 24 hr tablet, Take 150 mg by mouth daily., Disp: , Rfl:    FLUoxetine (PROZAC) 20 MG capsule, Take 40 mg by mouth daily., Disp: , Rfl:    LORazepam (ATIVAN) 0.5 MG tablet, Take 0.5 mg by mouth as directed., Disp: , Rfl:    predniSONE (DELTASONE) 10 MG tablet, Take 3 tablets (30 mg total) by mouth daily with breakfast., Disp: 15 tablet, Rfl: 0   rizatriptan (MAXALT) 10 MG tablet, Take 1 tablet by mouth as needed., Disp: , Rfl:    spironolactone (ALDACTONE) 50 MG tablet, Take 50 mg by mouth 2 (two) times daily., Disp: , Rfl:    topiramate (TOPAMAX) 25 MG tablet, Take 25 mg by mouth as directed., Disp: , Rfl:    traZODone (DESYREL) 50 MG tablet, Take 50 mg by mouth at bedtime as needed for sleep., Disp: , Rfl:   Observations/Objective: Patient is well-developed, well-nourished in no acute distress.  Resting comfortably  at home.  Head is normocephalic, atraumatic.  No labored breathing.  Speech is clear and coherent with logical content.  Patient is alert and oriented at baseline.    Assessment and Plan: 1. Suspected UTI - nitrofurantoin, macrocrystal-monohydrate, (MACROBID) 100 MG capsule; Take 1 capsule (100 mg total) by mouth 2 (two) times daily.  Dispense: 10 capsule; Refill: 0  - Worsening symptoms.  - Will treat empirically with Macrobid - May use AZO for bladder spasms - Continue to push fluids.  - Seek in person evaluation for urine culture if  symptoms do not improve or if they worsen.    Follow Up Instructions: I discussed the assessment and treatment plan with the patient. The patient was provided an opportunity to ask questions and all were answered. The patient agreed with the plan and demonstrated an understanding of the instructions.  A copy of instructions were sent to the patient via MyChart unless otherwise noted below.    The patient was advised to call back or seek an in-person evaluation if the symptoms worsen or if the condition fails to improve as anticipated.  Time:  I spent 8 minutes with the patient via telehealth technology discussing the above problems/concerns.    [name], PA-C

## 2022-07-18 NOTE — Patient Instructions (Signed)
April Moon, thank you for joining April Loveless, PA-C for today's virtual visit.  While this provider is not your primary care provider (PCP), if your PCP is located in our provider database this encounter information will be shared with them immediately following your visit.   A [facility] MyChart account gives you access to today's visit and all your visits, tests, and labs performed at [facility] " click here if you don't have a [facility] MyChart account or go to [email or web address]  Consent: (Patient) April Moon provided verbal consent for this virtual visit at the beginning of the encounter.  Current Medications:  Current Outpatient Medications:    nitrofurantoin, macrocrystal-monohydrate, (MACROBID) 100 MG capsule, Take 1 capsule (100 mg total) by mouth 2 (two) times daily., Disp: 10 capsule, Rfl: 0   albuterol (VENTOLIN HFA) 108 (90 Base) MCG/ACT inhaler, Inhale 1-2 puffs into the lungs every 6 (six) hours as needed for wheezing or shortness of breath., Disp: 8.5 g, Rfl: 0   amphetamine-dextroamphetamine (ADDERALL XR) 10 MG 24 hr capsule, Take 10 mg by mouth daily., Disp: , Rfl:    amphetamine-dextroamphetamine (ADDERALL) 5 MG tablet, Take 5 mg by mouth daily., Disp: , Rfl:    benzonatate (TESSALON) 100 MG capsule, Take 1 capsule (100 mg total) by mouth 3 (three) times daily as needed for cough., Disp: 30 capsule, Rfl: 0   buPROPion (WELLBUTRIN XL) 150 MG 24 hr tablet, Take 150 mg by mouth daily., Disp: , Rfl:    FLUoxetine (PROZAC) 20 MG capsule, Take 40 mg by mouth daily., Disp: , Rfl:    LORazepam (ATIVAN) 0.5 MG tablet, Take 0.5 mg by mouth as directed., Disp: , Rfl:    predniSONE (DELTASONE) 10 MG tablet, Take 3 tablets (30 mg total) by mouth daily with breakfast., Disp: 15 tablet, Rfl: 0   rizatriptan (MAXALT) 10 MG tablet, Take 1 tablet by mouth as needed., Disp: , Rfl:    spironolactone (ALDACTONE) 50 MG tablet, Take 50 mg by mouth 2 (two)  times daily., Disp: , Rfl:    topiramate (TOPAMAX) 25 MG tablet, Take 25 mg by mouth as directed., Disp: , Rfl:    traZODone (DESYREL) 50 MG tablet, Take 50 mg by mouth at bedtime as needed for sleep., Disp: , Rfl:    Medications ordered in this encounter:  Meds ordered this encounter  Medications   nitrofurantoin, macrocrystal-monohydrate, (MACROBID) 100 MG capsule    Sig: Take 1 capsule (100 mg total) by mouth 2 (two) times daily.    Dispense:  10 capsule    Refill:  0    Order Specific Question:   Supervising Provider    Answer:   Merrilee Jansky [id number]     *If you need refills on other medications prior to your next appointment, please contact your pharmacy*  Follow-Up: Call back or seek an in-person evaluation if the symptoms worsen or if the condition fails to improve as anticipated.  [facility] [phone]  Other Instructions Urinary Tract Infection, Adult  A urinary tract infection (UTI) is an infection of any part of the urinary tract. The urinary tract includes the kidneys, ureters, bladder, and urethra. These organs make, store, and get rid of urine in the body. An upper UTI affects the ureters and kidneys. A lower UTI affects the bladder and urethra. What are the causes? Most urinary tract infections are caused by bacteria in your genital area around your urethra, where urine leaves your body. These bacteria grow  and cause inflammation of your urinary tract. What increases the risk? You are more likely to develop this condition if: You have a urinary catheter that stays in place. You are not able to control when you urinate or have a bowel movement (incontinence). You are female and you: Use a spermicide or diaphragm for birth control. Have low estrogen levels. Are pregnant. You have certain genes that increase your risk. You are sexually active. You take antibiotic medicines. You have a condition that causes your flow of urine to slow down,  such as: An enlarged prostate, if you are female. Blockage in your urethra. A kidney stone. A nerve condition that affects your bladder control (neurogenic bladder). Not getting enough to drink, or not urinating often. You have certain medical conditions, such as: Diabetes. A weak disease-fighting system (immunesystem). Sickle cell disease. Gout. Spinal cord injury. What are the signs or symptoms? Symptoms of this condition include: Needing to urinate right away (urgency). Frequent urination. This may include small amounts of urine each time you urinate. Pain or burning with urination. Blood in the urine. Urine that smells bad or unusual. Trouble urinating. Cloudy urine. Vaginal discharge, if you are female. Pain in the abdomen or the lower back. You may also have: Vomiting or a decreased appetite. Confusion. Irritability or tiredness. A fever or chills. Diarrhea. The first symptom in older adults may be confusion. In some cases, they may not have any symptoms until the infection has worsened. How is this diagnosed? This condition is diagnosed based on your medical history and a physical exam. You may also have other tests, including: Urine tests. Blood tests. Tests for STIs (sexually transmitted infections). If you have had more than one UTI, a cystoscopy or imaging studies may be done to determine the cause of the infections. How is this treated? Treatment for this condition includes: Antibiotic medicine. Over-the-counter medicines to treat discomfort. Drinking enough water to stay hydrated. If you have frequent infections or have other conditions such as a kidney stone, you may need to see a health care provider who specializes in the urinary tract (urologist). In rare cases, urinary tract infections can cause sepsis. Sepsis is a life-threatening condition that occurs when the body responds to an infection. Sepsis is treated in the hospital with IV antibiotics, fluids, and  other medicines. Follow these instructions at home:  Medicines Take over-the-counter and prescription medicines only as told by your health care provider. If you were prescribed an antibiotic medicine, take it as told by your health care provider. Do not stop using the antibiotic even if you start to feel better. General instructions Make sure you: Empty your bladder often and completely. Do not hold urine for long periods of time. Empty your bladder after sex. Wipe from front to back after urinating or having a bowel movement if you are female. Use each tissue only one time when you wipe. Drink enough fluid to keep your urine pale yellow. Keep all follow-up visits. This is important. Contact a health care provider if: Your symptoms do not get better after 1-2 days. Your symptoms go away and then return. Get help right away if: You have severe pain in your back or your lower abdomen. You have a fever or chills. You have nausea or vomiting. Summary A urinary tract infection (UTI) is an infection of any part of the urinary tract, which includes the kidneys, ureters, bladder, and urethra. Most urinary tract infections are caused by bacteria in your genital area. Treatment for  this condition often includes antibiotic medicines. If you were prescribed an antibiotic medicine, take it as told by your health care provider. Do not stop using the antibiotic even if you start to feel better. Keep all follow-up visits. This is important. This information is not intended to replace advice given to you by your health care provider. Make sure you discuss any questions you have with your health care provider. Document Revised: 10/11/2019 Document Reviewed: 10/11/2019 Elsevier Patient Education  2023 Elsevier Inc.    If you have been instructed to have an in-person evaluation today at a local Urgent Care facility, please use the link below. It will take you to a list of all of our available [facility], including address, phone number and hours of operation. Please do not delay care.  [facility]  If you or a family member do not have a primary care provider, use the link below to schedule a visit and establish care. When you choose a [facility] primary care physician or advanced practice provider, you gain a long-term partner in health. Find a Primary Care Provider  Learn more about [facility]'s in-office and virtual care options:  - Get Care Now

## 2022-07-20 ENCOUNTER — Ambulatory Visit (INDEPENDENT_AMBULATORY_CARE_PROVIDER_SITE_OTHER): Payer: BC Managed Care – PPO | Admitting: Physician Assistant

## 2022-07-20 ENCOUNTER — Encounter: Payer: Self-pay | Admitting: Physician Assistant

## 2022-07-20 VITALS — BP 120/72 | HR 80 | Ht 65.0 in | Wt 138.5 lb

## 2022-07-20 DIAGNOSIS — K219 Gastro-esophageal reflux disease without esophagitis: Secondary | ICD-10-CM

## 2022-07-20 DIAGNOSIS — R1013 Epigastric pain: Secondary | ICD-10-CM

## 2022-07-20 DIAGNOSIS — K900 Celiac disease: Secondary | ICD-10-CM | POA: Diagnosis not present

## 2022-07-20 DIAGNOSIS — D509 Iron deficiency anemia, unspecified: Secondary | ICD-10-CM | POA: Diagnosis not present

## 2022-07-20 MED ORDER — NA SULFATE-K SULFATE-MG SULF 17.5-3.13-1.6 GM/177ML PO SOLN: ORAL | 0 refills | Status: DC

## 2022-07-20 MED ORDER — OMEPRAZOLE 20 MG PO CPDR: 20.0000 mg | DELAYED_RELEASE_CAPSULE | Freq: Every day | ORAL | 3 refills | Status: DC

## 2022-07-20 NOTE — Progress Notes (Signed)
Chief Complaint: IDA, epigastric pain, celiac disease and GERD  HPI:    April Moon is a 42 year old female with a past medical history as listed below including anxiety, who was referred to me by Karle Plumber, MD for a complaint of iron deficiency anemia, epigastric pain, celiac disease and GERD.      04/22/2022 patient followed with hematology oncology and at that time discussed history of celiac disease diagnosed in 2007 and IBS.  Apparently her last EGD and colonoscopy were 7 years ago while in [place].  Reported history of benign colonic polyps at that point.    06/20/22 patient had her last visit with oncology/hematology.  She has been receiving IV iron but still had some fatigue and mild shortness of breath with exertion.  At that time discussed that her iron deficiency anemia was secondary to cycle and malabsorption with celiac disease.    06/20/2022 CBC with hemoglobin normal at 13.8, ferritin normal at 21 and iron studies normal.    Today, the patient tells me that she used to live in [place] and followed with a GI specialist there.  They diagnosed her celiac disease in 2007 and since then she has been strictly gluten-free.  She has had a total of 2 prior colonoscopies and endoscopies the last in 2012.  Does tell me she has a history of polyps, uncertain what kind.  Most recently describes them in January she was diagnosed with iron deficiency anemia though she had been close to this for some time and was finally sent to the hematologist, she has had 2 iron infusions and things are looking better.  Tells me that after starting iron infusions her periods changed and now she has 2 menstrual cycles a month and feels like she is losing more blood.    Also describes an epigastric pain over the past 3 to 4 weeks, apparently initially started with severe pain with nausea and 1 episode of vomiting and severely decreased appetite that was all over her abdomen and has gradually been getting slightly  better and is now just in her epigastrium.  Does tell me about a week or so after starting with the pain she developed a "COVID like upper respiratory illness".  Tells me currently she is having some increase in reflux and takes Tums for this, previously on a PPI prior to moving here.    Also describes her normal bowel movements radiate from diarrhea to constipation, she has always had diarrhea, it is just "my life", oftentimes she will go 3-4 times a day.    Denies fever, chills, weight loss or blood in her stool.  Past Medical History:  Diagnosis Date   ADD (attention deficit disorder)    Allergy    Anxiety    Carpal tunnel syndrome, right    Celiac disease    Chicken pox    Depression    prozac   Hirsutism    Migraine     Past Surgical History:  Procedure Laterality Date   LASIK  2006   TONSILLECTOMY     VAGINAL DELIVERY      Current Outpatient Medications  Medication Sig Dispense Refill   albuterol (VENTOLIN HFA) 108 (90 Base) MCG/ACT inhaler Inhale 1-2 puffs into the lungs every 6 (six) hours as needed for wheezing or shortness of breath. 8.5 g 0   amphetamine-dextroamphetamine (ADDERALL XR) 10 MG 24 hr capsule Take 10 mg by mouth daily.     amphetamine-dextroamphetamine (ADDERALL) 5 MG tablet Take 5  mg by mouth daily.     benzonatate (TESSALON) 100 MG capsule Take 1 capsule (100 mg total) by mouth 3 (three) times daily as needed for cough. 30 capsule 0   buPROPion (WELLBUTRIN XL) 150 MG 24 hr tablet Take 150 mg by mouth daily.     FLUoxetine (PROZAC) 20 MG capsule Take 40 mg by mouth daily.     LORazepam (ATIVAN) 0.5 MG tablet Take 0.5 mg by mouth as directed.     nitrofurantoin, macrocrystal-monohydrate, (MACROBID) 100 MG capsule Take 1 capsule (100 mg total) by mouth 2 (two) times daily. 10 capsule 0   predniSONE (DELTASONE) 10 MG tablet Take 3 tablets (30 mg total) by mouth daily with breakfast. 15 tablet 0   rizatriptan (MAXALT) 10 MG tablet Take 1 tablet by mouth as  needed.     spironolactone (ALDACTONE) 50 MG tablet Take 50 mg by mouth 2 (two) times daily.     topiramate (TOPAMAX) 25 MG tablet Take 25 mg by mouth as directed.     traZODone (DESYREL) 50 MG tablet Take 50 mg by mouth at bedtime as needed for sleep.     No current facility-administered medications for this visit.    Allergies as of 07/20/2022 - Review Complete 07/18/2022  Allergen Reaction Noted   Chicken allergy Diarrhea and Nausea And Vomiting 12/31/2015   Egg-derived products Diarrhea and Nausea And Vomiting 12/31/2015   Gluten meal Diarrhea and Nausea And Vomiting 12/31/2015   Sulfa antibiotics Hives, Nausea And Vomiting, and Other (See Comments) 11/08/2013    Family History  Problem Relation Age of Onset   Breast cancer Paternal Grandmother    Stomach cancer Paternal Grandmother    Ovarian cancer Paternal Grandmother    Autoimmune disease Mother        Langerhan's cell histiocytosis   Barrett's esophagus Father    Breast cancer Maternal Grandmother    Breast cancer Maternal Aunt     Social History   Socioeconomic History   Marital status: Married    Spouse name: Not on file   Number of children: Not on file   Years of education: Not on file   Highest education level: Not on file  Occupational History   Not on file  Tobacco Use   Smoking status: Never   Smokeless tobacco: Never  Vaping Use   Vaping Use: Never used  Substance and Sexual Activity   Alcohol use: Yes   Drug use: No   Sexual activity: Yes  Other Topics Concern   Not on file  Social History Narrative   Divorced   Works a Corporate treasurer arm of [employer]   1 daughter born 2015   Grew up in [place].   Recently moved from [place].    Social Determinants of Health   Financial Resource Strain: Not on file  Food Insecurity: No Food Insecurity (04/22/2022)   Hunger Vital Sign    Worried About Running Out of Food in the Last Year: Never true    Ran Out of Food  in the Last Year: Never true  Transportation Needs: No Transportation Needs (04/22/2022)   PRAPARE - Administrator, Civil Service (Medical): No    Lack of Transportation (Non-Medical): No  Physical Activity: Not on file  Stress: Not on file  Social Connections: Not on file  Intimate Partner Violence: Not At Risk (04/22/2022)   Humiliation, Afraid, Rape, and Kick questionnaire    Fear of Current or Ex-Partner: No  Emotionally Abused: No    Physically Abused: No    Sexually Abused: No    Review of Systems:    Constitutional: No weight loss, fever or chills Skin: No rash Cardiovascular: No chest pain Respiratory: No SOB Gastrointestinal: See HPI and otherwise negative Genitourinary: No dysuria  Neurological: No headache, dizziness or syncope Musculoskeletal: No new muscle or joint pain Hematologic: No bleeding  Psychiatric: No history of depression or anxiety   Physical Exam:  Vital signs: BP 120/72   Pulse 80   Ht 5\' 5"  (1.651 m)   Wt 138 lb 8 oz (62.8 kg)   LMP 06/29/2022 (Exact Date)   SpO2 97%   BMI 23.05 kg/m    Constitutional:   Pleasant Caucasian female appears to be in NAD, Well developed, Well nourished, alert and cooperative Head:  Normocephalic and atraumatic. Eyes:   PEERL, EOMI. No icterus. Conjunctiva pink. Ears:  Normal auditory acuity. Neck:  Supple Throat: Oral cavity and pharynx without inflammation, swelling or lesion.  Respiratory: Respirations even and unlabored. Lungs clear to auscultation bilaterally.   No wheezes, crackles, or rhonchi.  Cardiovascular: Normal S1, S2. No MRG. Regular rate and rhythm. No peripheral edema, cyanosis or pallor.  Gastrointestinal:  Soft, nondistended, mil generalized ttp. No rebound or guarding. Normal bowel sounds. No appreciable masses or hepatomegaly. Rectal:  Not performed.  Msk:  Symmetrical without gross deformities. Without edema, no deformity or joint abnormality.  Neurologic:  Alert and  oriented x4;   grossly normal neurologically.  Skin:   Dry and intact without significant lesions or rashes. Psychiatric: Demonstrates good judgement and reason without abnormal affect or behaviors.  RELEVANT LABS AND IMAGING: CBC    Component Value Date/Time   WBC 9.4 06/20/2022 1336   WBC 5.1 09/17/2020 2130   RBC 5.16 (H) 06/20/2022 1336   RBC 5.25 (H) 06/20/2022 1336   HGB 13.8 06/20/2022 1336   HCT 43.7 06/20/2022 1336   PLT 353 06/20/2022 1336   MCV 83.2 06/20/2022 1336   MCH 26.3 06/20/2022 1336   MCHC 31.6 06/20/2022 1336   RDW 20.6 (H) 06/20/2022 1336   LYMPHSABS 2.1 06/20/2022 1336   MONOABS 0.5 06/20/2022 1336   EOSABS 0.3 06/20/2022 1336   BASOSABS 0.1 06/20/2022 1336    CMP     Component Value Date/Time   NA 138 04/22/2022 1449   K 3.6 04/22/2022 1449   CL 107 04/22/2022 1449   CO2 20 (L) 04/22/2022 1449   GLUCOSE 113 (H) 04/22/2022 1449   BUN 13 04/22/2022 1449   CREATININE 0.93 04/22/2022 1449   CALCIUM 9.4 04/22/2022 1449   PROT 7.8 04/22/2022 1449   ALBUMIN 4.8 04/22/2022 1449   AST 17 04/22/2022 1449   ALT 19 04/22/2022 1449   ALKPHOS 71 04/22/2022 1449   BILITOT 0.4 04/22/2022 1449   GFRNONAA >60 04/22/2022 1449   GFRAA >60 05/21/2017 1944    Assessment: 1.  Celiac disease: Diagnosed in 2007, patient is strictly gluten-free, requesting previous GI workup 2.  Epigastric pain: Recent increase in epigastric/generalized abdominal pain followed by a viral illness; could represent COVID/COVID-like illness and post bilateral IBS +/- gastritis 3.  IDA: Diagnosed in January, following with hematology, they think malabsorption from celiac disease; could likely be from celiac, but no endoscopic evaluation yet must also consider GI blood loss 4.  GERD: Increase in symptoms over the past few months, using Tums a lot, previously on a PPI; likely gastritis  Plan: 1.  Scheduled patient for diagnostic EGD  and colonoscopy in the [facility] with Dr. Leonides Schanz as she has sooner  availability and patient requested a female provider.  Did provide the patient a detailed list of risks for the procedures and she agrees to proceed. Patient is appropriate for endoscopic procedure(s) in the ambulatory ([facility]) setting.  2.  Will attempt to get records from [place] in regards to her GI history 3.  Started the patient on Omeprazole 20 mg every morning, 30-60 minutes before breakfast #30 with 3 refills. 4.  Recommend the patient follow-up with her gynecologist in regards to abnormal menstrual cycles 5.  Patient to follow in clinic per recommendations after above.  April Meeker, PA-C [facility] 07/20/2022, 9:23 AM  Cc: Karle Plumber, MD   07/21/2022 9:48 AM  Addendum  Received records from patient's previous GI physician.  08/27/2007 patient seen for celiac disease and diarrhea as well as epigastric pain.  At that time she had return to clinic after having normal EGD and colonoscopy.  She was continued on a gluten-free diet.  07/26/2007 colonoscopy for chronic diarrhea was normal.  EGD was normal.  07/27/2007 biopsies from the colon random showed normal large bowel.  Biopsies from the duodenum with no evidence of celiac disease.  No change to plans.  April Meeker, PA-C

## 2022-07-20 NOTE — Patient Instructions (Addendum)
You have been scheduled for an endoscopy and colonoscopy. Please follow the written instructions given to you at your visit today. Please pick up your prep supplies at the pharmacy within the next 1-3 days. If you use inhalers (even only as needed), please bring them with you on the day of your procedure.   We have sent the following medications to your pharmacy for you to pick up at your convenience: Omeprazole, Suprep  If your blood pressure at your visit was 140/90 or greater, please contact your primary care physician to follow up on this.  _______________________________________________________  If you are age 41 or older, your body mass index should be between 23-30. Your Body mass index is 23.05 kg/m. If this is out of the aforementioned range listed, please consider follow up with your Primary Care Provider.  If you are age 41 or younger, your body mass index should be between 19-25. Your Body mass index is 23.05 kg/m. If this is out of the aformentioned range listed, please consider follow up with your Primary Care Provider.   ________________________________________________________  The [facility] providers would like to encourage you to use [facility] to communicate with providers for non-urgent requests or questions.  Due to long hold times on the telephone, sending your provider a message by [facility] may be a faster and more efficient way to get a response.  Please allow 48 business hours for a response.  Please remember that this is for non-urgent requests.   Due to recent changes in healthcare laws, you may see the results of your imaging and laboratory studies on MyChart before your provider has had a chance to review them.  We understand that in some cases there may be results that are confusing or concerning to you. Not all laboratory results come back in the same time frame and the provider may be waiting for multiple results in order to interpret others.  Please give [place] 48 hours  in order for your provider to thoroughly review all the results before contacting the office for clarification of your results.    Thank you for entrusting me with your care and choosing [facility].  Hyacinth Meeker PA-C

## 2022-07-26 ENCOUNTER — Ambulatory Visit (INDEPENDENT_AMBULATORY_CARE_PROVIDER_SITE_OTHER): Payer: BC Managed Care – PPO | Admitting: Family

## 2022-07-26 ENCOUNTER — Encounter: Payer: Self-pay | Admitting: Family

## 2022-07-26 VITALS — BP 116/68 | HR 87 | Ht 65.0 in | Wt 141.4 lb

## 2022-07-26 DIAGNOSIS — F988 Other specified behavioral and emotional disorders with onset usually occurring in childhood and adolescence: Secondary | ICD-10-CM

## 2022-07-26 DIAGNOSIS — K900 Celiac disease: Secondary | ICD-10-CM

## 2022-07-26 DIAGNOSIS — N926 Irregular menstruation, unspecified: Secondary | ICD-10-CM | POA: Diagnosis not present

## 2022-07-26 DIAGNOSIS — D509 Iron deficiency anemia, unspecified: Secondary | ICD-10-CM

## 2022-07-26 DIAGNOSIS — G43909 Migraine, unspecified, not intractable, without status migrainosus: Secondary | ICD-10-CM | POA: Diagnosis not present

## 2022-07-26 LAB — POCT URINE PREGNANCY: Preg Test, Ur: NEGATIVE

## 2022-07-26 MED ORDER — ALBUTEROL SULFATE HFA 108 (90 BASE) MCG/ACT IN AERS: 1.0000 | INHALATION_SPRAY | Freq: Four times a day (QID) | RESPIRATORY_TRACT | 1 refills | Status: DC | PRN

## 2022-07-26 NOTE — Progress Notes (Signed)
April Moon is a 42 y.o. female with the following history as recorded in EpicCare:  Patient Active Problem List   Diagnosis Date Noted   IDA (iron deficiency anemia) 04/22/2022   Advanced maternal age in multigravida, third trimester 05/18/2017   ADD (attention deficit disorder)    Hirsutism    Migraine    Anxiety    Celiac disease 01/01/2016   Depression 01/01/2016   Environmental allergies 01/01/2016    Current Outpatient Medications  Medication Sig Dispense Refill   amphetamine-dextroamphetamine (ADDERALL XR) 10 MG 24 hr capsule Take 10 mg by mouth daily.     amphetamine-dextroamphetamine (ADDERALL) 5 MG tablet Take 5 mg by mouth daily.     buPROPion (WELLBUTRIN XL) 150 MG 24 hr tablet Take 150 mg by mouth daily.     FLUoxetine (PROZAC) 20 MG capsule Take 40 mg by mouth daily.     LORazepam (ATIVAN) 0.5 MG tablet Take 0.5 mg by mouth as directed.     Na Sulfate-K Sulfate-Mg Sulf 17.5-3.13-1.6 GM/177ML SOLN Use as directed; may use generic; goodrx card if insurance will not cover generic 354 mL 0   nitrofurantoin, macrocrystal-monohydrate, (MACROBID) 100 MG capsule Take 1 capsule (100 mg total) by mouth 2 (two) times daily. 10 capsule 0   omeprazole (PRILOSEC) 20 MG capsule Take 1 capsule (20 mg total) by mouth daily. Take 30 minutes before breakfast 30 capsule 3   rizatriptan (MAXALT) 10 MG tablet Take 1 tablet by mouth as needed.     spironolactone (ALDACTONE) 50 MG tablet Take 50 mg by mouth 2 (two) times daily.     topiramate (TOPAMAX) 25 MG tablet Take 25 mg by mouth as directed.     traZODone (DESYREL) 50 MG tablet Take 50 mg by mouth at bedtime as needed for sleep.     albuterol (VENTOLIN HFA) 108 (90 Base) MCG/ACT inhaler Inhale 1-2 puffs into the lungs every 6 (six) hours as needed for wheezing or shortness of breath. 8.5 g 1   No current facility-administered medications for this visit.    Allergies: Chicken allergy, Egg-derived products, Gluten meal, and Sulfa  antibiotics  Past Medical History:  Diagnosis Date   ADD (attention deficit disorder)    Allergy    Anxiety    Carpal tunnel syndrome, right    Celiac disease    Chicken pox    Depression    prozac   Hirsutism    Migraine     Past Surgical History:  Procedure Laterality Date   LASIK  2006   TONSILLECTOMY     VAGINAL DELIVERY     x2    Family History  Problem Relation Age of Onset   Autoimmune disease Mother        Langerhan's cell histiocytosis   Barrett's esophagus Father    Breast cancer Maternal Grandmother    Breast cancer Paternal Grandmother    Stomach cancer Paternal Grandmother    Ovarian cancer Paternal Grandmother    Breast cancer Maternal Aunt    Colon cancer Maternal Aunt    Esophageal cancer Neg Hx     Social History   Tobacco Use   Smoking status: Never   Smokeless tobacco: Never  Substance Use Topics   Alcohol use: Yes    Subjective:   Presents today as a new patient; wanted to get established with new PCP; Does see numerous specialists- under care of GI, GYN, hematology, psychiatrist [facility]), neurology ( Dr. Anne Hahn);  Planning to have endoscopy/ colonoscopy  next month- known history of Celiac Disease; was recently found to be extremely anemic and work up pending to determine the source;   LMP- periods have been irregular since February 2024;    Objective:  Vitals:   07/26/22 1040  BP: 116/68  Pulse: 87  SpO2: 98%  Weight: 141 lb 6.4 oz (64.1 kg)  Height: 5\' 5"  (1.651 m)    General: Well developed, well nourished, in no acute distress  Skin : Warm and dry.  Head: Normocephalic and atraumatic  Lungs: Respirations unlabored; clear to auscultation bilaterally without wheeze, rales, rhonchi  CVS exam: normal rate and regular rhythm.  Neurologic: Alert and oriented; speech intact; face symmetrical; moves all extremities well; CNII-XII intact without focal deficit   Assessment:  1. Irregular bleeding   2. Migraine without status  migrainosus, not intractable, unspecified migraine type   3. Celiac disease   4. Attention deficit disorder, unspecified hyperactivity presence   5. Iron deficiency anemia, unspecified iron deficiency anemia type     Plan:  Update pregnancy test in office- negative; discussed need for pelvic ultrasound- patient agrees but would like to have done with her GYN office; Stable; under care of neurology; Colonoscopy pending- follow up with GI as scheduled; Stable; under care of specialist; Under care of hematology; continue as regularly scheduled;   No follow-ups on file.  Orders Placed This Encounter  Procedures   POCT urine pregnancy    Requested Prescriptions   Signed Prescriptions Disp Refills   albuterol (VENTOLIN HFA) 108 (90 Base) MCG/ACT inhaler 8.5 g 1    Sig: Inhale 1-2 puffs into the lungs every 6 (six) hours as needed for wheezing or shortness of breath.

## 2022-07-28 NOTE — Progress Notes (Signed)
I agree with the assessment and plan as outlined by Ms. Lemmon. 

## 2022-08-05 ENCOUNTER — Inpatient Hospital Stay: Payer: BC Managed Care – PPO | Attending: Hematology & Oncology

## 2022-08-05 DIAGNOSIS — N920 Excessive and frequent menstruation with regular cycle: Secondary | ICD-10-CM | POA: Diagnosis not present

## 2022-08-05 DIAGNOSIS — D500 Iron deficiency anemia secondary to blood loss (chronic): Secondary | ICD-10-CM | POA: Diagnosis not present

## 2022-08-05 DIAGNOSIS — D509 Iron deficiency anemia, unspecified: Secondary | ICD-10-CM

## 2022-08-05 LAB — RETICULOCYTES
Immature Retic Fract: 13.3 % (ref 2.3–15.9)
RBC.: 4.36 MIL/uL (ref 3.87–5.11)
Retic Count, Absolute: 62.8 10*3/uL (ref 19.0–186.0)
Retic Ct Pct: 1.4 % (ref 0.4–3.1)

## 2022-08-05 LAB — CBC WITH DIFFERENTIAL (CANCER CENTER ONLY)
Abs Immature Granulocytes: 0.03 10*3/uL (ref 0.00–0.07)
Basophils Absolute: 0 10*3/uL (ref 0.0–0.1)
Basophils Relative: 1 %
Eosinophils Absolute: 0.1 10*3/uL (ref 0.0–0.5)
Eosinophils Relative: 2 %
HCT: 38.2 % (ref 36.0–46.0)
Hemoglobin: 12.4 g/dL (ref 12.0–15.0)
Immature Granulocytes: 0 %
Lymphocytes Relative: 21 %
Lymphs Abs: 1.7 10*3/uL (ref 0.7–4.0)
MCH: 27.9 pg (ref 26.0–34.0)
MCHC: 32.5 g/dL (ref 30.0–36.0)
MCV: 85.8 fL (ref 80.0–100.0)
Monocytes Absolute: 0.4 10*3/uL (ref 0.1–1.0)
Monocytes Relative: 5 %
Neutro Abs: 5.5 10*3/uL (ref 1.7–7.7)
Neutrophils Relative %: 71 %
Platelet Count: 279 10*3/uL (ref 150–400)
RBC: 4.45 MIL/uL (ref 3.87–5.11)
RDW: 14.9 % (ref 11.5–15.5)
WBC Count: 7.7 10*3/uL (ref 4.0–10.5)
nRBC: 0 % (ref 0.0–0.2)

## 2022-08-05 LAB — IRON AND IRON BINDING CAPACITY (CC-WL,HP ONLY)
Iron: 26 ug/dL — ABNORMAL LOW (ref 28–170)
Saturation Ratios: 7 % — ABNORMAL LOW (ref 10.4–31.8)
TIBC: 353 ug/dL (ref 250–450)
UIBC: 327 ug/dL (ref 148–442)

## 2022-08-05 LAB — FERRITIN: Ferritin: 6 ng/mL — ABNORMAL LOW (ref 11–307)

## 2022-08-15 ENCOUNTER — Inpatient Hospital Stay: Payer: BC Managed Care – PPO | Attending: Hematology & Oncology

## 2022-08-15 VITALS — BP 101/66 | HR 59 | Temp 98.5°F | Resp 18

## 2022-08-15 DIAGNOSIS — D509 Iron deficiency anemia, unspecified: Secondary | ICD-10-CM

## 2022-08-15 DIAGNOSIS — N920 Excessive and frequent menstruation with regular cycle: Secondary | ICD-10-CM | POA: Diagnosis not present

## 2022-08-15 DIAGNOSIS — D500 Iron deficiency anemia secondary to blood loss (chronic): Secondary | ICD-10-CM | POA: Diagnosis not present

## 2022-08-15 MED ORDER — SODIUM CHLORIDE 0.9 % IV SOLN
300.0000 mg | Freq: Once | INTRAVENOUS | Status: AC
  Administered 2022-08-15: 300 mg via INTRAVENOUS
  Filled 2022-08-15: qty 300

## 2022-08-15 MED ORDER — SODIUM CHLORIDE 0.9 % IV SOLN: Freq: Once | INTRAVENOUS | Status: AC

## 2022-08-15 NOTE — Patient Instructions (Signed)

## 2022-08-22 ENCOUNTER — Inpatient Hospital Stay: Payer: BC Managed Care – PPO

## 2022-08-22 VITALS — BP 98/67 | HR 62 | Temp 98.2°F | Resp 18

## 2022-08-22 DIAGNOSIS — D500 Iron deficiency anemia secondary to blood loss (chronic): Secondary | ICD-10-CM | POA: Diagnosis not present

## 2022-08-22 DIAGNOSIS — N920 Excessive and frequent menstruation with regular cycle: Secondary | ICD-10-CM | POA: Diagnosis not present

## 2022-08-22 DIAGNOSIS — D509 Iron deficiency anemia, unspecified: Secondary | ICD-10-CM

## 2022-08-22 MED ORDER — SODIUM CHLORIDE 0.9 % IV SOLN
300.0000 mg | Freq: Once | INTRAVENOUS | Status: AC
  Administered 2022-08-22: 300 mg via INTRAVENOUS
  Filled 2022-08-22: qty 300

## 2022-08-22 MED ORDER — SODIUM CHLORIDE 0.9 % IV SOLN: INTRAVENOUS | Status: DC

## 2022-08-22 NOTE — Patient Instructions (Signed)

## 2022-08-29 ENCOUNTER — Inpatient Hospital Stay: Payer: BC Managed Care – PPO

## 2022-08-29 VITALS — BP 100/63 | HR 65 | Temp 97.9°F | Resp 17

## 2022-08-29 DIAGNOSIS — D500 Iron deficiency anemia secondary to blood loss (chronic): Secondary | ICD-10-CM | POA: Diagnosis not present

## 2022-08-29 DIAGNOSIS — N920 Excessive and frequent menstruation with regular cycle: Secondary | ICD-10-CM | POA: Diagnosis not present

## 2022-08-29 DIAGNOSIS — D509 Iron deficiency anemia, unspecified: Secondary | ICD-10-CM

## 2022-08-29 MED ORDER — SODIUM CHLORIDE 0.9 % IV SOLN
300.0000 mg | Freq: Once | INTRAVENOUS | Status: AC
  Administered 2022-08-29: 300 mg via INTRAVENOUS
  Filled 2022-08-29: qty 300

## 2022-08-29 MED ORDER — SODIUM CHLORIDE 0.9 % IV SOLN: INTRAVENOUS | Status: DC

## 2022-08-29 NOTE — Patient Instructions (Signed)

## 2022-09-08 ENCOUNTER — Telehealth: Payer: Self-pay | Admitting: Physician Assistant

## 2022-09-08 ENCOUNTER — Telehealth: Payer: Self-pay

## 2022-09-08 NOTE — Telephone Encounter (Signed)
Called patient to let her know that Prep instructions was sent to her MyChart.

## 2022-09-08 NOTE — Telephone Encounter (Signed)
Inbound call from patient husband requesting prep instruction for procedure tomorrow 6/28 be sent through [facility] again. States the instructions were misplaced. Please advise, thank you.

## 2022-09-09 ENCOUNTER — Encounter: Payer: Self-pay | Admitting: Internal Medicine

## 2022-09-09 ENCOUNTER — Ambulatory Visit (AMBULATORY_SURGERY_CENTER): Payer: BC Managed Care – PPO | Admitting: Internal Medicine

## 2022-09-09 VITALS — BP 116/82 | HR 66 | Temp 97.5°F | Resp 16 | Ht 65.0 in | Wt 138.0 lb

## 2022-09-09 DIAGNOSIS — D509 Iron deficiency anemia, unspecified: Secondary | ICD-10-CM

## 2022-09-09 DIAGNOSIS — R1013 Epigastric pain: Secondary | ICD-10-CM

## 2022-09-09 MED ORDER — OMEPRAZOLE 40 MG PO CPDR
40.0000 mg | DELAYED_RELEASE_CAPSULE | Freq: Two times a day (BID) | ORAL | 3 refills | Status: DC
Start: 2022-09-09 — End: 2022-12-23

## 2022-09-09 MED ORDER — SODIUM CHLORIDE 0.9 % IV SOLN
500.0000 mL | Freq: Once | INTRAVENOUS | Status: DC
Start: 2022-09-09 — End: 2022-09-09

## 2022-09-09 NOTE — Patient Instructions (Signed)
-  await pathology results -repeat colonoscopy in 10 years for surveillance. -Continue present medications    YOU HAD AN ENDOSCOPIC PROCEDURE TODAY AT THE  ENDOSCOPY CENTER:   Refer to the procedure report that was given to you for any specific questions about what was found during the examination.  If the procedure report does not answer your questions, please call your gastroenterologist to clarify.  If you requested that your care partner not be given the details of your procedure findings, then the procedure report has been included in a sealed envelope for you to review at your convenience later.  YOU SHOULD EXPECT: Some feelings of bloating in the abdomen. Passage of more gas than usual.  Walking can help get rid of the air that was put into your GI tract during the procedure and reduce the bloating. If you had a lower endoscopy (such as a colonoscopy or flexible sigmoidoscopy) you may notice spotting of blood in your stool or on the toilet paper. If you underwent a bowel prep for your procedure, you may not have a normal bowel movement for a few days.  Please Note:  You might notice some irritation and congestion in your nose or some drainage.  This is from the oxygen used during your procedure.  There is no need for concern and it should clear up in a day or so.  SYMPTOMS TO REPORT IMMEDIATELY:  Following lower endoscopy (colonoscopy or flexible sigmoidoscopy):  Excessive amounts of blood in the stool  Significant tenderness or worsening of abdominal pains  Swelling of the abdomen that is new, acute  Fever of 100F or higher  Following upper endoscopy (EGD)  Vomiting of blood or coffee ground material  New chest pain or pain under the shoulder blades  Painful or persistently difficult swallowing  New shortness of breath  Fever of 100F or higher  Black, tarry-looking stools  For urgent or emergent issues, a gastroenterologist can be reached at any hour by calling [phone]  [phone]. Do not use MyChart messaging for urgent concerns.    DIET:  We do recommend a small meal at first, but then you may proceed to your regular diet.  Drink plenty of fluids but you should avoid alcoholic beverages for 24 hours.  ACTIVITY:  You should plan to take it easy for the rest of today and you should NOT DRIVE or use heavy machinery until tomorrow (because of the sedation medicines used during the test).    FOLLOW UP: Our staff will call the number listed on your records the next business day following your procedure.  We will call around 7:15- 8:00 am to check on you and address any questions or concerns that you may have regarding the information given to you following your procedure. If we do not reach you, we will leave a message.     If any biopsies were taken you will be contacted by phone or by letter within the next 1-3 weeks.  Please call us at [phone] if you have not heard about the biopsies in 3 weeks.    SIGNATURES/CONFIDENTIALITY: You and/or your care partner have signed paperwork which will be entered into your electronic medical record.  These signatures attest to the fact that that the information above on your After Visit Summary has been reviewed and is understood.  Full responsibility of the confidentiality of this discharge information lies with you and/or your care-partner.

## 2022-09-09 NOTE — Progress Notes (Signed)
GASTROENTEROLOGY PROCEDURE H&P NOTE   Primary Care Physician: Olive Bass, FNP    Reason for Procedure:   IDA, celiac disease, epigastric ab pain, GERD  Plan:    EGD/colonoscopy  Patient is appropriate for endoscopic procedure(s) in the ambulatory ([facility]) setting.  The nature of the procedure, as well as the risks, benefits, and alternatives were carefully and thoroughly reviewed with the patient. Ample time for discussion and questions allowed. The patient understood, was satisfied, and agreed to proceed.     HPI: April Moon is a 42 y.o. female who presents for EGD/colonoscopy for evaluation of IDA, celiac disease, epigastric ab pain, and GERD .  Patient was most recently seen in the Gastroenterology Clinic on 07/20/22.  No interval change in medical history since that appointment. Please refer to that note for full details regarding GI history and clinical presentation.   Past Medical History:  Diagnosis Date   ADD (attention deficit disorder)    Allergy    Anemia    Anxiety    Carpal tunnel syndrome, right    Celiac disease    Chicken pox    Depression    prozac   GERD (gastroesophageal reflux disease)    Hirsutism    Migraine     Past Surgical History:  Procedure Laterality Date   COLONOSCOPY     LASIK  2006   TONSILLECTOMY     UPPER GASTROINTESTINAL ENDOSCOPY     VAGINAL DELIVERY     x2    Prior to Admission medications   Medication Sig Start Date End Date Taking? Authorizing Provider  amphetamine-dextroamphetamine (ADDERALL XR) 10 MG 24 hr capsule Take 10 mg by mouth daily.   Yes [provider]  amphetamine-dextroamphetamine (ADDERALL) 5 MG tablet Take 5 mg by mouth daily.   Yes [provider]  buPROPion (WELLBUTRIN XL) 150 MG 24 hr tablet Take 150 mg by mouth daily. 01/13/22  Yes [provider]  FLUoxetine (PROZAC) 20 MG capsule Take 40 mg by mouth daily.   Yes [provider]  LORazepam (ATIVAN) 0.5 MG  tablet Take 0.5 mg by mouth as directed. 12/16/21  Yes [provider]  omeprazole (PRILOSEC) 20 MG capsule Take 1 capsule (20 mg total) by mouth daily. Take 30 minutes before breakfast 07/20/22  Yes Lemmon, Violet Baldy, PA  rizatriptan (MAXALT) 10 MG tablet Take 1 tablet by mouth as needed. 03/17/22  Yes [provider]  topiramate (TOPAMAX) 25 MG tablet Take 25 mg by mouth as directed. 03/17/22  Yes [provider]  traZODone (DESYREL) 50 MG tablet Take 50 mg by mouth at bedtime as needed for sleep. 12/21/21  Yes [provider]  albuterol (VENTOLIN HFA) 108 (90 Base) MCG/ACT inhaler Inhale 1-2 puffs into the lungs every 6 (six) hours as needed for wheezing or shortness of breath. 07/26/22   Olive Bass, FNP  nitrofurantoin, macrocrystal-monohydrate, (MACROBID) 100 MG capsule Take 1 capsule (100 mg total) by mouth 2 (two) times daily. 07/18/22   [name], PA-C  spironolactone (ALDACTONE) 50 MG tablet Take 50 mg by mouth 2 (two) times daily. 04/05/22   [provider]    Current Outpatient Medications  Medication Sig Dispense Refill   amphetamine-dextroamphetamine (ADDERALL XR) 10 MG 24 hr capsule Take 10 mg by mouth daily.     amphetamine-dextroamphetamine (ADDERALL) 5 MG tablet Take 5 mg by mouth daily.     buPROPion (WELLBUTRIN XL) 150 MG 24 hr tablet Take 150 mg by mouth  daily.     FLUoxetine (PROZAC) 20 MG capsule Take 40 mg by mouth daily.     LORazepam (ATIVAN) 0.5 MG tablet Take 0.5 mg by mouth as directed.     omeprazole (PRILOSEC) 20 MG capsule Take 1 capsule (20 mg total) by mouth daily. Take 30 minutes before breakfast 30 capsule 3   rizatriptan (MAXALT) 10 MG tablet Take 1 tablet by mouth as needed.     topiramate (TOPAMAX) 25 MG tablet Take 25 mg by mouth as directed.     traZODone (DESYREL) 50 MG tablet Take 50 mg by mouth at bedtime as needed for sleep.     albuterol (VENTOLIN HFA) 108 (90 Base) MCG/ACT inhaler Inhale  1-2 puffs into the lungs every 6 (six) hours as needed for wheezing or shortness of breath. 8.5 g 1   nitrofurantoin, macrocrystal-monohydrate, (MACROBID) 100 MG capsule Take 1 capsule (100 mg total) by mouth 2 (two) times daily. 10 capsule 0   spironolactone (ALDACTONE) 50 MG tablet Take 50 mg by mouth 2 (two) times daily.     Current Facility-Administered Medications  Medication Dose Route Frequency Provider Last Rate Last Admin   0.9 %  sodium chloride infusion  500 mL Intravenous Once Imogene Burn, MD        Allergies as of 09/09/2022 - Review Complete 09/09/2022  Allergen Reaction Noted   Chicken allergy Diarrhea and Nausea And Vomiting 12/31/2015   Egg-derived products Diarrhea and Nausea And Vomiting 12/31/2015   Gluten meal Diarrhea and Nausea And Vomiting 12/31/2015   Sulfa antibiotics Hives, Nausea And Vomiting, and Other (See Comments) 11/08/2013    Family History  Problem Relation Age of Onset   Autoimmune disease Mother        Langerhan's cell histiocytosis   Barrett's esophagus Father    Breast cancer Maternal Aunt    Colon cancer Maternal Aunt    Esophageal cancer Maternal Grandmother    Breast cancer Maternal Grandmother    Breast cancer Paternal Grandmother    Stomach cancer Paternal Grandmother    Ovarian cancer Paternal Grandmother    Rectal cancer Neg Hx     Social History   Socioeconomic History   Marital status: Married    Spouse name: Not on file   Number of children: 2   Years of education: Not on file   Highest education level: Not on file  Occupational History   Occupation: Self employed  Tobacco Use   Smoking status: Never   Smokeless tobacco: Never  Vaping Use   Vaping Use: Never used  Substance and Sexual Activity   Alcohol use: Yes    Comment: occ   Drug use: No   Sexual activity: Yes  Other Topics Concern   Not on file  Social History Narrative   Divorced   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   Recently moved from [place].    Social Determinants of Health   Financial Resource Strain: Not on file  Food Insecurity: No Food Insecurity (04/22/2022)   Hunger Vital Sign    Worried About Running Out of Food in the Last Year: Never true    Ran Out of Food in the Last Year: Never true  Transportation Needs: No Transportation Needs (04/22/2022)   PRAPARE - Administrator, Civil Service (Medical): No    Lack of Transportation (Non-Medical): No  Physical Activity: Not  on file  Stress: Not on file  Social Connections: Not on file  Intimate Partner Violence: Not At Risk (04/22/2022)   Humiliation, Afraid, Rape, and Kick questionnaire    Fear of Current or Ex-Partner: No    Emotionally Abused: No    Physically Abused: No    Sexually Abused: No    Physical Exam: Vital signs in last 24 hours: BP 99/68   Pulse 70   Temp (!) 97.5 F (36.4 C)   Ht 5\' 5"  (1.651 m)   Wt 138 lb (62.6 kg)   SpO2 99%   BMI 22.96 kg/m  GEN: NAD EYE: Sclerae anicteric ENT: MMM CV: Non-tachycardic Pulm: No increased WOB GI: Soft NEURO:  Alert & Oriented   Eulah Pont, MD [facility]   09/09/2022 3:13 PM

## 2022-09-09 NOTE — Op Note (Signed)
[facility] Patient Name: April Moon Procedure Date: 09/09/2022 3:18 PM MRN: [id number] Endoscopist: Madelyn Brunner [place] , , [id number] Age: 42 Referring MD:  Date of Birth: 1980/09/09 Gender: Female Account #: [id number] Procedure:                Colonoscopy Indications:              Iron deficiency anemia Medicines:                Monitored Anesthesia Care Procedure:                Pre-Anesthesia Assessment:                           - Prior to the procedure, a History and Physical                            was performed, and patient medications and                            allergies were reviewed. The patient's tolerance of                            previous anesthesia was also reviewed. The risks                            and benefits of the procedure and the sedation                            options and risks were discussed with the patient.                            All questions were answered, and informed consent                            was obtained. Prior Anticoagulants: The patient has                            taken no anticoagulant or antiplatelet agents. ASA                            Grade Assessment: II - A patient with mild systemic                            disease. After reviewing the risks and benefits,                            the patient was deemed in satisfactory condition to                            undergo the procedure.                           After obtaining informed consent, the colonoscope  was passed under direct vision. Throughout the                            procedure, the patient's blood pressure, pulse, and                            oxygen saturations were monitored continuously. The                            [id number] Colonoscope [id number] was introduced                            through the anus and advanced to the the terminal                            ileum. The colonoscopy was  performed without                            difficulty. The patient tolerated the procedure                            well. The quality of the bowel preparation was                            excellent. The terminal ileum, ileocecal valve,                            appendiceal orifice, and rectum were photographed. Scope In: 3:32:06 PM Scope Out: 3:45:20 PM Scope Withdrawal Time: 0 hours 11 minutes 13 seconds  Total Procedure Duration: 0 hours 13 minutes 14 seconds  Findings:                 The terminal ileum appeared normal.                           Non-bleeding internal hemorrhoids were found during                            retroflexion.                           The exam was otherwise without abnormality. Complications:            No immediate complications. Estimated Blood Loss:     Estimated blood loss: none. Impression:               - The examined portion of the ileum was normal.                           - Non-bleeding internal hemorrhoids.                           - The examination was otherwise normal.                           - No specimens collected. Recommendation:           -  Discharge patient to home (with escort).                           - Repeat colonoscopy in 10 years for screening                            purposes.                           - The findings and recommendations were discussed                            with the patient. Dr Particia Lather "Alan Ripper" Leonides Schanz,  09/09/2022 3:50:37 PM

## 2022-09-09 NOTE — Progress Notes (Signed)
Post procedure follow up phone call. No answer at number given.  Left message on voicemail.  

## 2022-09-09 NOTE — Progress Notes (Signed)
To pacu, VSS. Report to Rn,tb 

## 2022-09-09 NOTE — Op Note (Signed)
[facility] Patient Name: April Moon Procedure Date: 09/09/2022 3:20 PM MRN: [id number] Endoscopist: Madelyn Brunner [place] , , [id number] Age: 42 Referring MD:  Date of Birth: April 21, 1980 Gender: Female Account #: [id number] Procedure:                Upper GI endoscopy Indications:              Epigastric abdominal pain, Iron deficiency anemia,                            Heartburn Medicines:                Monitored Anesthesia Care Procedure:                Pre-Anesthesia Assessment:                           - Prior to the procedure, a History and Physical                            was performed, and patient medications and                            allergies were reviewed. The patient's tolerance of                            previous anesthesia was also reviewed. The risks                            and benefits of the procedure and the sedation                            options and risks were discussed with the patient.                            All questions were answered, and informed consent                            was obtained. Prior Anticoagulants: The patient has                            taken no anticoagulant or antiplatelet agents. ASA                            Grade Assessment: II - A patient with mild systemic                            disease. After reviewing the risks and benefits,                            the patient was deemed in satisfactory condition to                            undergo the procedure.  After obtaining informed consent, the endoscope was                            passed under direct vision. Throughout the                            procedure, the patient's blood pressure, pulse, and                            oxygen saturations were monitored continuously. The                            Olympus Scope [id number] was introduced through the                            mouth, and advanced to the second  part of duodenum.                            The upper GI endoscopy was accomplished without                            difficulty. The patient tolerated the procedure                            well. Scope In: Scope Out: Findings:                 The examined esophagus was normal. Biopsies were                            taken with a cold forceps for histology.                           Localized mildly erythematous mucosa without                            bleeding was found in the gastric antrum. Biopsies                            were taken with a cold forceps for histology.                           The examined duodenum was normal. Biopsies were                            taken with a cold forceps for histology. Complications:            No immediate complications. Estimated Blood Loss:     Estimated blood loss was minimal. Impression:               - Normal esophagus. Biopsied.                           - Erythematous mucosa in the antrum. Biopsied.                           -  Normal examined duodenum. Biopsied. Recommendation:           - Await pathology results.                           - Use Prilosec (omeprazole) 40 mg PO BID for 10                            weeks.                           - Return to GI clinic in 2-3 months.                           - Perform a colonoscopy today. Dr Particia Lather "Alan Ripper" Leonides Schanz,  09/09/2022 3:49:14 PM

## 2022-09-12 ENCOUNTER — Telehealth: Payer: Self-pay

## 2022-09-12 ENCOUNTER — Ambulatory Visit: Payer: BC Managed Care – PPO | Admitting: Nurse Practitioner

## 2022-09-12 NOTE — Telephone Encounter (Signed)
Left message on answering machine. 

## 2022-09-19 ENCOUNTER — Encounter: Payer: Self-pay | Admitting: Family

## 2022-09-19 ENCOUNTER — Encounter: Payer: Self-pay | Admitting: Internal Medicine

## 2022-09-19 ENCOUNTER — Inpatient Hospital Stay (HOSPITAL_BASED_OUTPATIENT_CLINIC_OR_DEPARTMENT_OTHER): Payer: BC Managed Care – PPO | Admitting: Family

## 2022-09-19 ENCOUNTER — Inpatient Hospital Stay: Payer: BC Managed Care – PPO | Attending: Hematology & Oncology

## 2022-09-19 VITALS — BP 100/63 | HR 79 | Temp 98.7°F | Resp 17 | Wt 138.0 lb

## 2022-09-19 DIAGNOSIS — D500 Iron deficiency anemia secondary to blood loss (chronic): Secondary | ICD-10-CM | POA: Diagnosis not present

## 2022-09-19 DIAGNOSIS — N920 Excessive and frequent menstruation with regular cycle: Secondary | ICD-10-CM | POA: Insufficient documentation

## 2022-09-19 DIAGNOSIS — K900 Celiac disease: Secondary | ICD-10-CM | POA: Insufficient documentation

## 2022-09-19 DIAGNOSIS — D509 Iron deficiency anemia, unspecified: Secondary | ICD-10-CM

## 2022-09-19 DIAGNOSIS — Z79899 Other long term (current) drug therapy: Secondary | ICD-10-CM | POA: Insufficient documentation

## 2022-09-19 LAB — CBC WITH DIFFERENTIAL (CANCER CENTER ONLY)
Abs Immature Granulocytes: 0.1 10*3/uL — ABNORMAL HIGH (ref 0.00–0.07)
Basophils Absolute: 0 10*3/uL (ref 0.0–0.1)
Basophils Relative: 1 %
Eosinophils Absolute: 0.1 10*3/uL (ref 0.0–0.5)
Eosinophils Relative: 1 %
HCT: 40.6 % (ref 36.0–46.0)
Hemoglobin: 13.3 g/dL (ref 12.0–15.0)
Immature Granulocytes: 1 %
Lymphocytes Relative: 21 %
Lymphs Abs: 1.6 10*3/uL (ref 0.7–4.0)
MCH: 30 pg (ref 26.0–34.0)
MCHC: 32.8 g/dL (ref 30.0–36.0)
MCV: 91.4 fL (ref 80.0–100.0)
Monocytes Absolute: 0.3 10*3/uL (ref 0.1–1.0)
Monocytes Relative: 4 %
Neutro Abs: 5.3 10*3/uL (ref 1.7–7.7)
Neutrophils Relative %: 72 %
Platelet Count: 239 10*3/uL (ref 150–400)
RBC: 4.44 MIL/uL (ref 3.87–5.11)
RDW: 13.8 % (ref 11.5–15.5)
WBC Count: 7.3 10*3/uL (ref 4.0–10.5)
nRBC: 0 % (ref 0.0–0.2)

## 2022-09-19 LAB — RETICULOCYTES
Immature Retic Fract: 6.5 % (ref 2.3–15.9)
RBC.: 4.47 MIL/uL (ref 3.87–5.11)
Retic Count, Absolute: 78.2 10*3/uL (ref 19.0–186.0)
Retic Ct Pct: 1.8 % (ref 0.4–3.1)

## 2022-09-19 LAB — IRON AND IRON BINDING CAPACITY (CC-WL,HP ONLY)
Iron: 92 ug/dL (ref 28–170)
Saturation Ratios: 35 % — ABNORMAL HIGH (ref 10.4–31.8)
TIBC: 263 ug/dL (ref 250–450)
UIBC: 171 ug/dL (ref 148–442)

## 2022-09-19 LAB — FERRITIN: Ferritin: 123 ng/mL (ref 11–307)

## 2022-09-19 NOTE — Progress Notes (Signed)
Hematology and Oncology Follow Up Visit  April Moon [id number] [birth year] 42 y.o. 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 She is doing well and is feeling better since receiving her IV iron last month.  She had her colonoscopy and EGD 2 weeks ago Colonoscopy was negative and EGD only showed localized mildly erythematous mucosa without bleeding in the gastric antrum. She is taking Prilosec BID.  Her cycle was occurring every 3 weeks but this last month occurred at 4 weeks.  No other blood loss noted. No abnormal bruising, no petechiae.  No fever, chills, n/v, cough, rash, dizziness, SOB, chest pain, palpitations or changes in bowel or bladder habits.  She has intermittent abdominal pain.  No swelling in her extremities.  No falls or syncope.  Appetite and hydration are good. Weight is stable at   ECOG Performance Status: 1 - Symptomatic but completely ambulatory  Medications:  Allergies as of 09/19/2022       Reactions   Chicken Allergy Diarrhea, Nausea And Vomiting   Egg-derived Products Diarrhea, Nausea And Vomiting   Gluten Meal Diarrhea, Nausea And Vomiting   Sulfa Antibiotics Hives, Nausea And Vomiting, Other (See Comments)   Dizziness        Medication List        Accurate as of September 19, 2022 12:09 PM. If you have any questions, ask your nurse or doctor.          albuterol 108 (90 Base) MCG/ACT inhaler Commonly known as: VENTOLIN HFA Inhale 1-2 puffs into the lungs every 6 (six) hours as needed for wheezing or shortness of breath.   amphetamine-dextroamphetamine 10 MG 24 hr capsule Commonly known as: ADDERALL XR Take 10 mg by mouth daily.   amphetamine-dextroamphetamine 5 MG tablet Commonly known as: ADDERALL Take 5 mg by mouth daily.   buPROPion  150 MG 24 hr tablet Commonly known as: WELLBUTRIN XL Take 150 mg by mouth daily.   FLUoxetine 20 MG capsule Commonly known as: PROZAC Take 40 mg by mouth daily.   LORazepam 0.5 MG tablet Commonly known as: ATIVAN Take 0.5 mg by mouth as directed.   nitrofurantoin (macrocrystal-monohydrate) 100 MG capsule Commonly known as: Macrobid Take 1 capsule (100 mg total) by mouth 2 (two) times daily.   omeprazole 40 MG capsule Commonly known as: PRILOSEC Take 1 capsule (40 mg total) by mouth 2 (two) times daily. Take for 10 weeks   rizatriptan 10 MG tablet Commonly known as: MAXALT Take 1 tablet by mouth as needed.   spironolactone 50 MG tablet Commonly known as: ALDACTONE Take 50 mg by mouth 2 (two) times daily.   topiramate 25 MG tablet Commonly known as: TOPAMAX Take 25 mg by mouth as directed.   traZODone 50 MG tablet Commonly known as: DESYREL Take 50 mg by mouth at bedtime as needed for sleep.        Allergies:  Allergies  Allergen Reactions   Chicken Allergy Diarrhea and Nausea And Vomiting   Egg-Derived Products Diarrhea and Nausea And Vomiting   Gluten Meal Diarrhea and Nausea And Vomiting   Sulfa Antibiotics Hives, Nausea And Vomiting and Other (See Comments)    Dizziness    Past Medical History, Surgical history, Social history, and Family History were reviewed  and updated.  Review of Systems: All other 10 point review of systems is negative.   Physical Exam:  weight is 138 lb (62.6 kg). Her oral temperature is 98.7 F (37.1 C). Her blood pressure is 100/63 and her pulse is 79. Her respiration is 17 and oxygen saturation is 99%.   Wt Readings from Last 3 Encounters:  09/19/22 138 lb (62.6 kg)  09/09/22 138 lb (62.6 kg)  07/26/22 141 lb 6.4 oz (64.1 kg)    Ocular: Sclerae unicteric, pupils equal, round and reactive to light Ear-nose-throat: Oropharynx clear, dentition fair Lymphatic: No cervical or supraclavicular adenopathy Lungs no rales or  rhonchi, good excursion bilaterally Heart regular rate and rhythm, no murmur appreciated Abd soft, nontender, positive bowel sounds MSK no focal spinal tenderness, no joint edema Neuro: non-focal, well-oriented, appropriate affect Breasts: Deferred   Lab Results  Component Value Date   WBC 7.3 09/19/2022   HGB 13.3 09/19/2022   HCT 40.6 09/19/2022   MCV 91.4 09/19/2022   PLT 239 09/19/2022   Lab Results  Component Value Date   FERRITIN 6 (L) 08/05/2022   IRON 26 (L) 08/05/2022   TIBC 353 08/05/2022   UIBC 327 08/05/2022   IRONPCTSAT 7 (L) 08/05/2022   Lab Results  Component Value Date   RETICCTPCT 1.8 09/19/2022   RBC 4.47 09/19/2022   RBC 4.44 09/19/2022   No results found for: "KPAFRELGTCHN", "LAMBDASER", "KAPLAMBRATIO" No results found for: "IGGSERUM", "IGA", "IGMSERUM" No results found for: "TOTALPROTELP", "ALBUMINELP", "A1GS", "A2GS", "BETS", "BETA2SER", "GAMS", "MSPIKE", "SPEI"   Chemistry      Component Value Date/Time   NA 138 04/22/2022 1449   K 3.6 04/22/2022 1449   CL 107 04/22/2022 1449   CO2 20 (L) 04/22/2022 1449   BUN 13 04/22/2022 1449   CREATININE 0.93 04/22/2022 1449      Component Value Date/Time   CALCIUM 9.4 04/22/2022 1449   ALKPHOS 71 04/22/2022 1449   AST 17 04/22/2022 1449   ALT 19 04/22/2022 1449   BILITOT 0.4 04/22/2022 1449       Impression and Plan: April Moon is a very pleasant 42 yo caucasian female with iron deficiency anemia secondary to cycle and malabsorption with celiac disease.  Iron studies are pending.  Lab only in 8 weeks and follow-up in 4 months.   Eileen Stanford, NP 7/8/202412:09 PM

## 2022-12-05 ENCOUNTER — Encounter: Payer: Self-pay | Admitting: Family

## 2022-12-05 DIAGNOSIS — F900 Attention-deficit hyperactivity disorder, predominantly inattentive type: Secondary | ICD-10-CM | POA: Diagnosis not present

## 2022-12-05 DIAGNOSIS — F331 Major depressive disorder, recurrent, moderate: Secondary | ICD-10-CM | POA: Diagnosis not present

## 2022-12-05 DIAGNOSIS — F419 Anxiety disorder, unspecified: Secondary | ICD-10-CM | POA: Diagnosis not present

## 2022-12-15 ENCOUNTER — Inpatient Hospital Stay: Payer: BC Managed Care – PPO | Attending: Hematology & Oncology

## 2022-12-15 DIAGNOSIS — D500 Iron deficiency anemia secondary to blood loss (chronic): Secondary | ICD-10-CM | POA: Insufficient documentation

## 2022-12-15 DIAGNOSIS — N920 Excessive and frequent menstruation with regular cycle: Secondary | ICD-10-CM | POA: Diagnosis not present

## 2022-12-15 DIAGNOSIS — D509 Iron deficiency anemia, unspecified: Secondary | ICD-10-CM

## 2022-12-15 LAB — CBC WITH DIFFERENTIAL (CANCER CENTER ONLY)
Abs Immature Granulocytes: 0.07 10*3/uL (ref 0.00–0.07)
Basophils Absolute: 0.1 10*3/uL (ref 0.0–0.1)
Basophils Relative: 1 %
Eosinophils Absolute: 0.1 10*3/uL (ref 0.0–0.5)
Eosinophils Relative: 1 %
HCT: 41.4 % (ref 36.0–46.0)
Hemoglobin: 13.8 g/dL (ref 12.0–15.0)
Immature Granulocytes: 1 %
Lymphocytes Relative: 20 %
Lymphs Abs: 1.5 10*3/uL (ref 0.7–4.0)
MCH: 30.2 pg (ref 26.0–34.0)
MCHC: 33.3 g/dL (ref 30.0–36.0)
MCV: 90.6 fL (ref 80.0–100.0)
Monocytes Absolute: 0.3 10*3/uL (ref 0.1–1.0)
Monocytes Relative: 4 %
Neutro Abs: 5.8 10*3/uL (ref 1.7–7.7)
Neutrophils Relative %: 73 %
Platelet Count: 262 10*3/uL (ref 150–400)
RBC: 4.57 MIL/uL (ref 3.87–5.11)
RDW: 12.7 % (ref 11.5–15.5)
WBC Count: 7.9 10*3/uL (ref 4.0–10.5)
nRBC: 0 % (ref 0.0–0.2)

## 2022-12-15 LAB — RETICULOCYTES
Immature Retic Fract: 7.3 % (ref 2.3–15.9)
RBC.: 4.5 MIL/uL (ref 3.87–5.11)
Retic Count, Absolute: 64.8 10*3/uL (ref 19.0–186.0)
Retic Ct Pct: 1.4 % (ref 0.4–3.1)

## 2022-12-15 LAB — IRON AND IRON BINDING CAPACITY (CC-WL,HP ONLY)
Iron: 106 ug/dL (ref 28–170)
Saturation Ratios: 34 % — ABNORMAL HIGH (ref 10.4–31.8)
TIBC: 314 ug/dL (ref 250–450)
UIBC: 208 ug/dL (ref 148–442)

## 2022-12-15 LAB — FERRITIN: Ferritin: 71 ng/mL (ref 11–307)

## 2022-12-23 ENCOUNTER — Ambulatory Visit (INDEPENDENT_AMBULATORY_CARE_PROVIDER_SITE_OTHER): Payer: BC Managed Care – PPO | Admitting: Internal Medicine

## 2022-12-23 ENCOUNTER — Encounter: Payer: Self-pay | Admitting: Internal Medicine

## 2022-12-23 VITALS — BP 102/60 | HR 80 | Ht 65.0 in | Wt 136.0 lb

## 2022-12-23 DIAGNOSIS — R5383 Other fatigue: Secondary | ICD-10-CM | POA: Diagnosis not present

## 2022-12-23 DIAGNOSIS — K219 Gastro-esophageal reflux disease without esophagitis: Secondary | ICD-10-CM

## 2022-12-23 DIAGNOSIS — Z862 Personal history of diseases of the blood and blood-forming organs and certain disorders involving the immune mechanism: Secondary | ICD-10-CM

## 2022-12-23 DIAGNOSIS — D509 Iron deficiency anemia, unspecified: Secondary | ICD-10-CM

## 2022-12-23 DIAGNOSIS — K900 Celiac disease: Secondary | ICD-10-CM | POA: Diagnosis not present

## 2022-12-23 DIAGNOSIS — R197 Diarrhea, unspecified: Secondary | ICD-10-CM

## 2022-12-23 DIAGNOSIS — R1030 Lower abdominal pain, unspecified: Secondary | ICD-10-CM

## 2022-12-23 MED ORDER — OMEPRAZOLE 40 MG PO CPDR: 40.0000 mg | DELAYED_RELEASE_CAPSULE | Freq: Every day | ORAL | 2 refills | Status: DC

## 2022-12-23 MED ORDER — DICYCLOMINE HCL 10 MG PO CAPS: 10.0000 mg | ORAL_CAPSULE | Freq: Three times a day (TID) | ORAL | 0 refills | Status: DC | PRN

## 2022-12-23 NOTE — Progress Notes (Signed)
Chief Complaint: IDA, epigastric pain, celiac disease and GERD  HPI:  Mrs. Sautter is a 42 year old female with a past medical history of celiac disease, GERD, IDA, and anxiety for a complaint of iron deficiency anemia, epigastric pain, celiac disease and GERD.    Prior History: Patient used to live in [place] and followed with a GI specialist there.  They diagnosed her celiac disease in 2007 and since then she has been strictly gluten-free. Had IDA in the past for which she was treated with IV iron infusions. Does have menstrual periods. Patient works for [employer].  Interval History: Ever since her last EGD, she is on omeprazole 40 mg twice daily. She tries to take it regularly but sometimes misses a dose. Reflux is better on the BID PPI. Her pain was originally in the lower abdomen, which has also improved. She has celiac disease and has managed this with a strict gluten free diet. She is still having diarrhea. She has on average 2-6 BMs per day. Rarely has nocturnal stools. She feels weak and tired. She has lost 30 lbs this year. She still has her gallbladder. She has tried Imodium in the past, which seems to work pretty well but she does not take it regularly. Denies significant bloating.  Wt Readings from Last 3 Encounters:  12/23/22 136 lb (61.7 kg)  09/19/22 138 lb (62.6 kg)  09/09/22 138 lb (62.6 kg)   Past Medical History:  Diagnosis Date   ADD (attention deficit disorder)    Allergy    Anemia    Anxiety    Carpal tunnel syndrome, right    Celiac disease    Chicken pox    Depression    prozac   GERD (gastroesophageal reflux disease)    Hirsutism    Migraine     Past Surgical History:  Procedure Laterality Date   COLONOSCOPY     LASIK  2006   TONSILLECTOMY     UPPER GASTROINTESTINAL ENDOSCOPY     VAGINAL DELIVERY     x2    Current Outpatient Medications  Medication Sig Dispense Refill   albuterol (VENTOLIN HFA) 108 (90 Base) MCG/ACT inhaler Inhale 1-2 puffs into the  lungs every 6 (six) hours as needed for wheezing or shortness of breath. 8.5 g 1   amphetamine-dextroamphetamine (ADDERALL XR) 10 MG 24 hr capsule Take 10 mg by mouth daily.     amphetamine-dextroamphetamine (ADDERALL) 5 MG tablet Take 5 mg by mouth daily.     buPROPion (WELLBUTRIN XL) 150 MG 24 hr tablet Take 150 mg by mouth daily.     FLUoxetine (PROZAC) 20 MG capsule Take 40 mg by mouth daily.     LORazepam (ATIVAN) 0.5 MG tablet Take 0.5 mg by mouth as directed.     omeprazole (PRILOSEC) 40 MG capsule Take 1 capsule (40 mg total) by mouth 2 (two) times daily. Take for 10 weeks 60 capsule 3   rizatriptan (MAXALT) 10 MG tablet Take 1 tablet by mouth as needed.     spironolactone (ALDACTONE) 50 MG tablet Take 50 mg by mouth 2 (two) times daily.     topiramate (TOPAMAX) 25 MG tablet Take 25 mg by mouth as directed.     traZODone (DESYREL) 50 MG tablet Take 50 mg by mouth at bedtime as needed for sleep.     No current facility-administered medications for this visit.    Allergies as of 12/23/2022 - Review Complete 12/23/2022  Allergen Reaction Noted   Chicken allergy Diarrhea and  Nausea And Vomiting 12/31/2015   Egg-derived products Diarrhea and Nausea And Vomiting 12/31/2015   Gluten meal Diarrhea and Nausea And Vomiting 12/31/2015   Sulfa antibiotics Hives, Nausea And Vomiting, and Other (See Comments) 11/08/2013    Family History  Problem Relation Age of Onset   Autoimmune disease Mother        Langerhan's cell histiocytosis   Barrett's esophagus Father    Breast cancer Maternal Aunt    Colon cancer Maternal Aunt    Esophageal cancer Maternal Grandmother    Breast cancer Maternal Grandmother    Breast cancer Paternal Grandmother    Stomach cancer Paternal Grandmother    Ovarian cancer Paternal Grandmother    Rectal cancer Neg Hx     Social History   Socioeconomic History   Marital status: Married    Spouse name: Not on file   Number of children: 2   Years of education:  Not on file   Highest education level: Not on file  Occupational History   Occupation: Self employed  Tobacco Use   Smoking status: Never   Smokeless tobacco: Never  Vaping Use   Vaping status: Never Used  Substance and Sexual Activity   Alcohol use: Yes    Comment: occ   Drug use: No   Sexual activity: Yes  Other Topics Concern   Not on file  Social History Narrative   Divorced   Works a Corporate treasurer arm of [employer]   1 daughter born 2015   Grew up in [place].   Recently moved from [place].    Social Determinants of Health   Financial Resource Strain: Not on file  Food Insecurity: No Food Insecurity (04/22/2022)   Hunger Vital Sign    Worried About Running Out of Food in the Last Year: Never true    Ran Out of Food in the Last Year: Never true  Transportation Needs: No Transportation Needs (04/22/2022)   PRAPARE - Administrator, Civil Service (Medical): No    Lack of Transportation (Non-Medical): No  Physical Activity: Not on file  Stress: Not on file  Social Connections: Not on file  Intimate Partner Violence: Not At Risk (04/22/2022)   Humiliation, Afraid, Rape, and Kick questionnaire    Fear of Current or Ex-Partner: No    Emotionally Abused: No    Physically Abused: No    Sexually Abused: No     Physical Exam:  Vital signs: BP 102/60   Pulse 80   Ht 5\' 5"  (1.651 m)   Wt 136 lb (61.7 kg)   BMI 22.63 kg/m    Constitutional:   Pleasant Caucasian female appears to be in NAD, Well developed, Well nourished, alert and cooperative Head:  Normocephalic and atraumatic. Respiratory: Respirations even and unlabored.  Cardiovascular: Regular rate  Gastrointestinal:  Soft, nondistended, non-tender. Normal bowel sounds. No appreciable masses or hepatomegaly. Msk:  Symmetrical without gross deformities. Without edema, no deformity or joint abnormality.  Neurologic:  Alert and  oriented x4;  grossly normal neurologically.   Psychiatric: Demonstrates good judgement and reason without abnormal affect or behaviors.  RELEVANT LABS AND IMAGING: CBC    Component Value Date/Time   WBC 7.9 12/15/2022 1107   WBC 5.1 09/17/2020 2130   RBC 4.50 12/15/2022 1107   RBC 4.57 12/15/2022 1107   HGB 13.8 12/15/2022 1107   HCT 41.4 12/15/2022 1107   PLT 262 12/15/2022 1107   MCV 90.6 12/15/2022 1107   MCH  30.2 12/15/2022 1107   MCHC 33.3 12/15/2022 1107   RDW 12.7 12/15/2022 1107   LYMPHSABS 1.5 12/15/2022 1107   MONOABS 0.3 12/15/2022 1107   EOSABS 0.1 12/15/2022 1107   BASOSABS 0.1 12/15/2022 1107    CMP     Component Value Date/Time   NA 138 04/22/2022 1449   K 3.6 04/22/2022 1449   CL 107 04/22/2022 1449   CO2 20 (L) 04/22/2022 1449   GLUCOSE 113 (H) 04/22/2022 1449   BUN 13 04/22/2022 1449   CREATININE 0.93 04/22/2022 1449   CALCIUM 9.4 04/22/2022 1449   PROT 7.8 04/22/2022 1449   ALBUMIN 4.8 04/22/2022 1449   AST 17 04/22/2022 1449   ALT 19 04/22/2022 1449   ALKPHOS 71 04/22/2022 1449   BILITOT 0.4 04/22/2022 1449   GFRNONAA >60 04/22/2022 1449   GFRAA >60 05/21/2017 1944   08/27/2007 patient seen for celiac disease and diarrhea as well as epigastric pain.  At that time she had return to clinic after having normal EGD and colonoscopy.  She was continued on a gluten-free diet.  07/26/2007 colonoscopy for chronic diarrhea was normal.  EGD was normal.  07/27/2007 biopsies from the colon random showed normal large bowel.  Biopsies from the duodenum with no evidence of celiac disease.  04/22/2022 patient followed with hematology oncology and at that time discussed history of celiac disease diagnosed in 2007 and IBS.  Apparently her last EGD and colonoscopy were 7 years ago while in [place].  Reported history of benign colonic polyps at that point.  06/20/22 patient had her last visit with oncology/hematology.  She has been receiving IV iron but still had some fatigue and mild shortness of breath with  exertion.  At that time discussed that her iron deficiency anemia was secondary to cycle and malabsorption with celiac disease.  06/20/2022 CBC with hemoglobin normal at 13.8, ferritin normal at 21 and iron studies normal.  09/09/2022 EGD and colonoscopy. - Normal esophagus. Biopsied. - Erythematous mucosa in the antrum. Biopsied. - Normal examined duodenum. Biopsied. - The examined portion of the ileum was normal. - Non- bleeding internal hemorrhoids. - The examination was otherwise normal. - No specimens collected. Path: 1. Surgical [P], duodenum - BENIGN SMALL BOWEL MUCOSA WITH NO SIGNIFICANT PATHOLOGIC CHANGES 2. Surgical [P], gastric - GASTRIC ANTRAL AND OXYNTIC MUCOSA WITH NO SPECIFIC PATHOLOGIC CHANGES - NEGATIVE FOR H. PYLORI ON H&E AND IMMUNOHISTOCHEMICAL STAIN - NEGATIVE FOR INTESTINAL METAPLASIA OR MALIGNANCY 3. Surgical [P], esophagus - SQUAMOUS MUCOSA WITH NO SPECIFIC PATHOLOGIC CHANGES - NEGATIVE FOR INCREASED INTRAEPITHELIAL EOSINOPHILS - NEGATIVE FOR DYSPLASIA OR MALIGNANCY  Labs 12/2022: CBC nml. Ferritin 71. Iron/TIBC nml.  Assessment: Fatigue Diarrhea Celiac disease Lower ab pain - improved History of IDA GERD  Patient recently had both an EGD and colonoscopy, which ruled out any significant sources of her prior IDA. Her celiac disease appears to be under good control based upon her duodenal biopsies. I suspect that her prior IDA may have been primarily due to her menstrual periods. Patient has had improvement in her reflux on PPI therapy, and her prior abdominal pain has also resolved. Will plan to decrease her PPI to the lowest effective dose. However patient continues to feel fatigued and describes issues with diarrhea. Thus will check her for vitamin deficiencies and check her thyroid level to ensure that this is normal. In the meantime, I instructed her that it would be okay to use an antidiarrheal medication. If her lower ab pain returns, will give her  Bentyl to have  on hand to use, which will help with both ab pain and diarrhea. Will also plan to check her for SIBO, which could account for her ongoing symptoms as well.  Plan: - Decrease omeprazole 40 mg from BID to every day - Check Vit D, vitamin B12, vitamin A, vitamin E, PT/INR, folate acid, cortisol, TSH - Okay to use Imodium  - Start Bentyl 10 mg TID PRN  - Check SIBO  - RTC 2 months  Eulah Pont, MD [facility] 12/23/2022, 3:52 PM  I spent 40 minutes of time, including in depth chart review, independent review of results as outlined above, communicating results with the patient directly, face-to-face time with the patient, coordinating care, ordering studies and medications as appropriate, and documentation.

## 2022-12-23 NOTE — Patient Instructions (Addendum)
Your provider has requested that you go to the basement level for lab work before leaving today. Press "B" on the elevator. The lab is located at the first door on the left as you exit the elevator.  We have sent the following medications to your pharmacy for you to pick up at your convenience: Omeprazole, Bentyl  You are scheduled for a follow up visit on 03/03/23 3:40 pm  You have been given a testing kit to check for small intestine bacterial overgrowth (SIBO) which is completed by a company named [facility]. Make sure to return your test in the mail using the return mailing label given to you along with the kit. The test order, your demographic and insurance information have all already been sent to the company. [facility] will collect an upfront charge of $99.74 for commercial insurance plans and $209.74 if you are paying cash. Make sure to discuss with [facility] PRIOR to having the test to see if they have gotten information from your insurance company as to how much your testing will cost out of pocket, if any. Please contact [facility] at phone number [phone] to get instructions regarding how to perform the test as our office is unable to give specific testing instructions.   If your blood pressure at your visit was 140/90 or greater, please contact your primary care physician to follow up on this.  _______________________________________________________  If you are age 41 or older, your body mass index should be between 23-30. Your Body mass index is 22.63 kg/m. If this is out of the aforementioned range listed, please consider follow up with your Primary Care Provider.  If you are age 41 or younger, your body mass index should be between 19-25. Your Body mass index is 22.63 kg/m. If this is out of the aformentioned range listed, please consider follow up with your Primary Care Provider.   ________________________________________________________  The [facility]  GI providers would like to encourage you to use [facility] to communicate with providers for non-urgent requests or questions.  Due to long hold times on the telephone, sending your provider a message by [facility] may be a faster and more efficient way to get a response.  Please allow 48 business hours for a response.  Please remember that this is for non-urgent requests.  _______________________________________________________   Due to recent changes in healthcare laws, you may see the results of your imaging and laboratory studies on MyChart before your provider has had a chance to review them.  We understand that in some cases there may be results that are confusing or concerning to you. Not all laboratory results come back in the same time frame and the provider may be waiting for multiple results in order to interpret others.  Please give [place] 48 hours in order for your provider to thoroughly review all the results before contacting the office for clarification of your results.

## 2022-12-25 ENCOUNTER — Telehealth: Payer: BC Managed Care – PPO | Admitting: Nurse Practitioner

## 2022-12-25 DIAGNOSIS — R399 Unspecified symptoms and signs involving the genitourinary system: Secondary | ICD-10-CM

## 2022-12-25 MED ORDER — NITROFURANTOIN MONOHYD MACRO 100 MG PO CAPS
100.0000 mg | ORAL_CAPSULE | Freq: Two times a day (BID) | ORAL | 0 refills | Status: AC
Start: 2022-12-25 — End: 2022-12-30

## 2022-12-25 NOTE — Progress Notes (Signed)
Virtual Visit Consent   Truly Stankiewicz, you are scheduled for a virtual visit with a [facility] provider today. Just as with appointments in the office, your consent must be obtained to participate. Your consent will be active for this visit and any virtual visit you may have with one of our providers in the next 365 days. If you have a MyChart account, a copy of this consent can be sent to you electronically.  As this is a virtual visit, video technology does not allow for your provider to perform a traditional examination. This may limit your provider's ability to fully assess your condition. If your provider identifies any concerns that need to be evaluated in person or the need to arrange testing (such as labs, EKG, etc.), we will make arrangements to do so. Although advances in technology are sophisticated, we cannot ensure that it will always work on either your end or our end. If the connection with a video visit is poor, the visit may have to be switched to a telephone visit. With either a video or telephone visit, we are not always able to ensure that we have a secure connection.  By engaging in this virtual visit, you consent to the provision of healthcare and authorize for your insurance to be billed (if applicable) for the services provided during this visit. Depending on your insurance coverage, you may receive a charge related to this service.  I need to obtain your verbal consent now. Are you willing to proceed with your visit today? April Moon has provided verbal consent on 12/25/2022 for a virtual visit (video or telephone). Claiborne Rigg, NP  Date: 12/25/2022 8:38 AM  Virtual Visit via Video Note   I, Claiborne Rigg, connected with  April Moon  ([id number], 12-08-1980) on 12/25/22 at  8:30 AM EDT by a video-enabled telemedicine application and verified that I am speaking with the correct person using two identifiers.  Location: Patient: Virtual Visit Location  Patient: Home Provider: Virtual Visit Location Provider: Home Office   I discussed the limitations of evaluation and management by telemedicine and the availability of in person appointments. The patient expressed understanding and agreed to proceed.    History of Present Illness: April Moon is a 42 y.o. who identifies as a female who was assigned female at birth, and is being seen today for UTI Symptoms.  April Moon has been experiencing the following symptoms over the past 24 hours: Urine Frequency, Pelvic pain. Some spotting but believes this may be due to her menstrual cycle. Denies fever, nausea or vomiting. States current symptoms are similar to previous UTI symptoms in the past.    Problems:  Patient Active Problem List   Diagnosis Date Noted   IDA (iron deficiency anemia) 04/22/2022   Advanced maternal age in multigravida, third trimester 05/18/2017   ADD (attention deficit disorder)    Hirsutism    Migraine    Anxiety    Celiac disease 01/01/2016   Depression 01/01/2016   Environmental allergies 01/01/2016    Allergies:  Allergies  Allergen Reactions   Chicken Allergy Diarrhea and Nausea And Vomiting   Egg-Derived Products Diarrhea and Nausea And Vomiting   Gluten Meal Diarrhea and Nausea And Vomiting   Sulfa Antibiotics Hives, Nausea And Vomiting and Other (See Comments)    Dizziness   Medications:  Current Outpatient Medications:    nitrofurantoin, macrocrystal-monohydrate, (MACROBID) 100 MG capsule, Take 1 capsule (100 mg total) by mouth 2 (two) times daily for 5 days.,  Disp: 10 capsule, Rfl: 0   albuterol (VENTOLIN HFA) 108 (90 Base) MCG/ACT inhaler, Inhale 1-2 puffs into the lungs every 6 (six) hours as needed for wheezing or shortness of breath., Disp: 8.5 g, Rfl: 1   amphetamine-dextroamphetamine (ADDERALL XR) 10 MG 24 hr capsule, Take 10 mg by mouth daily., Disp: , Rfl:    amphetamine-dextroamphetamine (ADDERALL) 5 MG tablet, Take 5 mg by mouth daily.,  Disp: , Rfl:    buPROPion (WELLBUTRIN XL) 150 MG 24 hr tablet, Take 150 mg by mouth daily., Disp: , Rfl:    dicyclomine (BENTYL) 10 MG capsule, Take 1 capsule (10 mg total) by mouth 3 (three) times daily as needed., Disp: 90 capsule, Rfl: 0   FLUoxetine (PROZAC) 20 MG capsule, Take 40 mg by mouth daily., Disp: , Rfl:    LORazepam (ATIVAN) 0.5 MG tablet, Take 0.5 mg by mouth as directed., Disp: , Rfl:    omeprazole (PRILOSEC) 40 MG capsule, Take 1 capsule (40 mg total) by mouth daily. Take for 10 weeks, Disp: 30 capsule, Rfl: 2   rizatriptan (MAXALT) 10 MG tablet, Take 1 tablet by mouth as needed., Disp: , Rfl:    spironolactone (ALDACTONE) 50 MG tablet, Take 50 mg by mouth 2 (two) times daily., Disp: , Rfl:    topiramate (TOPAMAX) 25 MG tablet, Take 25 mg by mouth as directed., Disp: , Rfl:    traZODone (DESYREL) 50 MG tablet, Take 50 mg by mouth at bedtime as needed for sleep., Disp: , Rfl:   Observations/Objective: Patient is well-developed, well-nourished in no acute distress.  Resting comfortably at home.  Head is normocephalic, atraumatic.  No labored breathing.  Speech is clear and coherent with logical content.  Patient is alert and oriented at baseline.    Assessment and Plan: 1. UTI symptoms - nitrofurantoin, macrocrystal-monohydrate, (MACROBID) 100 MG capsule; Take 1 capsule (100 mg total) by mouth 2 (two) times daily for 5 days.  Dispense: 10 capsule; Refill: 0    Follow Up Instructions: I discussed the assessment and treatment plan with the patient. The patient was provided an opportunity to ask questions and all were answered. The patient agreed with the plan and demonstrated an understanding of the instructions.  A copy of instructions were sent to the patient via MyChart unless otherwise noted below.    The patient was advised to call back or seek an in-person evaluation if the symptoms worsen or if the condition fails to improve as anticipated.    Claiborne Rigg, NP

## 2022-12-25 NOTE — Patient Instructions (Signed)
[name], thank you for joining Claiborne Rigg, NP for today's virtual visit.  While this provider is not your primary care provider (PCP), if your PCP is located in our provider database this encounter information will be shared with them immediately following your visit.   A [facility] MyChart account gives you access to today's visit and all your visits, tests, and labs performed at [facility] " click here if you don't have a [facility] MyChart account or go to [email or web address]  Consent: (Patient) Mahdiya Mossberg provided verbal consent for this virtual visit at the beginning of the encounter.  Current Medications:  Current Outpatient Medications:    nitrofurantoin, macrocrystal-monohydrate, (MACROBID) 100 MG capsule, Take 1 capsule (100 mg total) by mouth 2 (two) times daily for 5 days., Disp: 10 capsule, Rfl: 0   albuterol (VENTOLIN HFA) 108 (90 Base) MCG/ACT inhaler, Inhale 1-2 puffs into the lungs every 6 (six) hours as needed for wheezing or shortness of breath., Disp: 8.5 g, Rfl: 1   amphetamine-dextroamphetamine (ADDERALL XR) 10 MG 24 hr capsule, Take 10 mg by mouth daily., Disp: , Rfl:    amphetamine-dextroamphetamine (ADDERALL) 5 MG tablet, Take 5 mg by mouth daily., Disp: , Rfl:    buPROPion (WELLBUTRIN XL) 150 MG 24 hr tablet, Take 150 mg by mouth daily., Disp: , Rfl:    dicyclomine (BENTYL) 10 MG capsule, Take 1 capsule (10 mg total) by mouth 3 (three) times daily as needed., Disp: 90 capsule, Rfl: 0   FLUoxetine (PROZAC) 20 MG capsule, Take 40 mg by mouth daily., Disp: , Rfl:    LORazepam (ATIVAN) 0.5 MG tablet, Take 0.5 mg by mouth as directed., Disp: , Rfl:    omeprazole (PRILOSEC) 40 MG capsule, Take 1 capsule (40 mg total) by mouth daily. Take for 10 weeks, Disp: 30 capsule, Rfl: 2   rizatriptan (MAXALT) 10 MG tablet, Take 1 tablet by mouth as needed., Disp: , Rfl:    spironolactone (ALDACTONE) 50 MG tablet, Take 50 mg by mouth 2 (two) times  daily., Disp: , Rfl:    topiramate (TOPAMAX) 25 MG tablet, Take 25 mg by mouth as directed., Disp: , Rfl:    traZODone (DESYREL) 50 MG tablet, Take 50 mg by mouth at bedtime as needed for sleep., Disp: , Rfl:    Medications ordered in this encounter:  Meds ordered this encounter  Medications   nitrofurantoin, macrocrystal-monohydrate, (MACROBID) 100 MG capsule    Sig: Take 1 capsule (100 mg total) by mouth 2 (two) times daily for 5 days.    Dispense:  10 capsule    Refill:  0    Order Specific Question:   Supervising Provider    Answer:   Merrilee Jansky [id number]     *If you need refills on other medications prior to your next appointment, please contact your pharmacy*  Follow-Up: Call back or seek an in-person evaluation if the symptoms worsen or if the condition fails to improve as anticipated.  [facility] [phone]  Other Instructions May take over the counter AZO for pain   If you have been instructed to have an in-person evaluation today at a local Urgent Care facility, please use the link below. It will take you to a list of all of our available [facility], including address, phone number and hours of operation. Please do not delay care.  [facility]  If you or a family member do not have a primary care  provider, use the link below to schedule a visit and establish care. When you choose a [facility] primary care physician or advanced practice provider, you gain a long-term partner in health. Find a Primary Care Provider  Learn more about [facility]'s in-office and virtual care options: [facility] - Get Care Now

## 2023-01-19 ENCOUNTER — Inpatient Hospital Stay: Payer: BC Managed Care – PPO | Admitting: Medical Oncology

## 2023-01-19 ENCOUNTER — Inpatient Hospital Stay: Payer: BC Managed Care – PPO

## 2023-01-24 ENCOUNTER — Inpatient Hospital Stay (HOSPITAL_BASED_OUTPATIENT_CLINIC_OR_DEPARTMENT_OTHER): Payer: BC Managed Care – PPO | Admitting: Medical Oncology

## 2023-01-24 ENCOUNTER — Encounter: Payer: Self-pay | Admitting: Medical Oncology

## 2023-01-24 ENCOUNTER — Inpatient Hospital Stay: Payer: BC Managed Care – PPO | Attending: Hematology & Oncology

## 2023-01-24 VITALS — BP 103/72 | HR 75 | Temp 98.8°F | Resp 18 | Wt 128.1 lb

## 2023-01-24 DIAGNOSIS — K900 Celiac disease: Secondary | ICD-10-CM

## 2023-01-24 DIAGNOSIS — K909 Intestinal malabsorption, unspecified: Secondary | ICD-10-CM | POA: Diagnosis not present

## 2023-01-24 DIAGNOSIS — L659 Nonscarring hair loss, unspecified: Secondary | ICD-10-CM | POA: Diagnosis not present

## 2023-01-24 DIAGNOSIS — N926 Irregular menstruation, unspecified: Secondary | ICD-10-CM

## 2023-01-24 DIAGNOSIS — D500 Iron deficiency anemia secondary to blood loss (chronic): Secondary | ICD-10-CM | POA: Diagnosis not present

## 2023-01-24 DIAGNOSIS — D649 Anemia, unspecified: Secondary | ICD-10-CM

## 2023-01-24 DIAGNOSIS — N920 Excessive and frequent menstruation with regular cycle: Secondary | ICD-10-CM | POA: Insufficient documentation

## 2023-01-24 DIAGNOSIS — D509 Iron deficiency anemia, unspecified: Secondary | ICD-10-CM

## 2023-01-24 LAB — CBC WITH DIFFERENTIAL (CANCER CENTER ONLY)
Abs Immature Granulocytes: 0.03 10*3/uL (ref 0.00–0.07)
Basophils Absolute: 0.1 10*3/uL (ref 0.0–0.1)
Basophils Relative: 1 %
Eosinophils Absolute: 0.1 10*3/uL (ref 0.0–0.5)
Eosinophils Relative: 2 %
HCT: 43.5 % (ref 36.0–46.0)
Hemoglobin: 14.7 g/dL (ref 12.0–15.0)
Immature Granulocytes: 1 %
Lymphocytes Relative: 34 %
Lymphs Abs: 2.2 10*3/uL (ref 0.7–4.0)
MCH: 30.9 pg (ref 26.0–34.0)
MCHC: 33.8 g/dL (ref 30.0–36.0)
MCV: 91.4 fL (ref 80.0–100.0)
Monocytes Absolute: 0.3 10*3/uL (ref 0.1–1.0)
Monocytes Relative: 4 %
Neutro Abs: 3.8 10*3/uL (ref 1.7–7.7)
Neutrophils Relative %: 58 %
Platelet Count: 304 10*3/uL (ref 150–400)
RBC: 4.76 MIL/uL (ref 3.87–5.11)
RDW: 12.2 % (ref 11.5–15.5)
WBC Count: 6.4 10*3/uL (ref 4.0–10.5)
nRBC: 0 % (ref 0.0–0.2)

## 2023-01-24 LAB — RETICULOCYTES
Immature Retic Fract: 5.6 % (ref 2.3–15.9)
RBC.: 4.62 MIL/uL (ref 3.87–5.11)
Retic Count, Absolute: 64.7 10*3/uL (ref 19.0–186.0)
Retic Ct Pct: 1.4 % (ref 0.4–3.1)

## 2023-01-24 LAB — FERRITIN: Ferritin: 51 ng/mL (ref 11–307)

## 2023-01-24 NOTE — Progress Notes (Signed)
Hematology and Oncology Follow Up Visit  April Moon [id number] 14-Oct-1980 42 y.o. 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  Recent Colonoscopy was negative and EGD only showed localized mildly erythematous mucosa without bleeding in the gastric antrum. She is taking Prilosec BID.  Menstrual cycles are now irregular from 10-21 days. Irregular flow and length of cycle. GYN saw her in Jan. She has not had a pelvic US since pregnancy 5 years ago.  No other blood loss noted. No abnormal bruising, no petechiae.  No fever, chills, n/v, cough, rash, dizziness, SOB, chest pain, palpitations or changes in bowel or bladder habits.  She has intermittent abdominal pain.  No swelling in her extremities.  No falls or syncope.  Appetite and hydration are a bit lower - she thinks that this might be due to being busy. Stress is high.  Wt Readings from Last 3 Encounters:  01/24/23 128 lb 1.3 oz (58.1 kg)  12/23/22 136 lb (61.7 kg)  09/19/22 138 lb (62.6 kg)   ECOG Performance Status: 1 - Symptomatic but completely ambulatory  Medications:  Allergies as of 01/24/2023       Reactions   Chicken Allergy Diarrhea, Nausea And Vomiting   Egg-derived Products Diarrhea, Nausea And Vomiting   Gluten Meal Diarrhea, Nausea And Vomiting   Sulfa Antibiotics Hives, Nausea And Vomiting, Other (See Comments)   Dizziness        Medication List        Accurate as of January 24, 2023  2:56 PM. If you have any questions, ask your nurse or doctor.          albuterol 108 (90 Base) MCG/ACT inhaler Commonly known  as: VENTOLIN HFA Inhale 1-2 puffs into the lungs every 6 (six) hours as needed for wheezing or shortness of breath.   amphetamine-dextroamphetamine 10 MG 24 hr capsule Commonly known as: ADDERALL XR Take 10 mg by mouth daily.   amphetamine-dextroamphetamine 5 MG tablet Commonly known as: ADDERALL Take 5 mg by mouth daily.   buPROPion 150 MG 24 hr tablet Commonly known as: WELLBUTRIN XL Take 150 mg by mouth daily.   dicyclomine 10 MG capsule Commonly known as: BENTYL Take 1 capsule (10 mg total) by mouth 3 (three) times daily as needed.   FLUoxetine 20 MG capsule Commonly known as: PROZAC Take 40 mg by mouth daily.   LORazepam 0.5 MG tablet Commonly known as: ATIVAN Take 0.5 mg by mouth as directed.   omeprazole 40 MG capsule Commonly known as: PRILOSEC Take 1 capsule (40 mg total) by mouth daily. Take for 10 weeks   rizatriptan 10 MG tablet Commonly known as: MAXALT Take 1 tablet by mouth as needed.   spironolactone 50 MG tablet Commonly known as: ALDACTONE Take 50 mg by mouth 2 (two) times daily.   topiramate 25 MG tablet Commonly known as: TOPAMAX Take 25 mg by mouth as directed.   traZODone 50 MG tablet Commonly known as: DESYREL Take 50 mg by mouth at bedtime as needed for sleep.  Allergies:  Allergies  Allergen Reactions   Chicken Allergy Diarrhea and Nausea And Vomiting   Egg-Derived Products Diarrhea and Nausea And Vomiting   Gluten Meal Diarrhea and Nausea And Vomiting   Sulfa Antibiotics Hives, Nausea And Vomiting and Other (See Comments)    Dizziness    Past Medical History, Surgical history, Social history, and Family History were reviewed and updated.  Review of Systems: All other 10 point review of systems is negative.   Physical Exam:  weight is 128 lb 1.3 oz (58.1 kg). Her oral temperature is 98.8 F (37.1 C). Her blood pressure is 103/72 and her pulse is 75. Her respiration is 18 and oxygen saturation is 100%.   Wt Readings  from Last 3 Encounters:  01/24/23 128 lb 1.3 oz (58.1 kg)  12/23/22 136 lb (61.7 kg)  09/19/22 138 lb (62.6 kg)    Ocular: Sclerae unicteric, pupils equal, round and reactive to light Ear-nose-throat: Oropharynx clear, dentition fair Lymphatic: No cervical or supraclavicular adenopathy Lungs no rales or rhonchi, good excursion bilaterally Heart regular rate and rhythm, no murmur appreciated Abd soft, nontender, positive bowel sounds MSK no focal spinal tenderness, no joint edema Neuro: non-focal, well-oriented, appropriate affect Breasts: Deferred   Lab Results  Component Value Date   WBC 6.4 01/24/2023   HGB 14.7 01/24/2023   HCT 43.5 01/24/2023   MCV 91.4 01/24/2023   PLT 304 01/24/2023   Lab Results  Component Value Date   FERRITIN 71 12/15/2022   IRON 106 12/15/2022   TIBC 314 12/15/2022   UIBC 208 12/15/2022   IRONPCTSAT 34 (H) 12/15/2022   Lab Results  Component Value Date   RETICCTPCT 1.4 01/24/2023   RBC 4.62 01/24/2023   RBC 4.76 01/24/2023   No results found for: "KPAFRELGTCHN", "LAMBDASER", "KAPLAMBRATIO" No results found for: "IGGSERUM", "IGA", "IGMSERUM" No results found for: "TOTALPROTELP", "ALBUMINELP", "A1GS", "A2GS", "BETS", "BETA2SER", "GAMS", "MSPIKE", "SPEI"   Chemistry      Component Value Date/Time   NA 138 04/22/2022 1449   K 3.6 04/22/2022 1449   CL 107 04/22/2022 1449   CO2 20 (L) 04/22/2022 1449   BUN 13 04/22/2022 1449   CREATININE 0.93 04/22/2022 1449      Component Value Date/Time   CALCIUM 9.4 04/22/2022 1449   ALKPHOS 71 04/22/2022 1449   AST 17 04/22/2022 1449   ALT 19 04/22/2022 1449   BILITOT 0.4 04/22/2022 1449     Encounter Diagnosis  Name Primary?   Irregular menses Yes   Impression and Plan: April Moon is a very pleasant 42 yo caucasian female with iron deficiency anemia secondary to cycle and malabsorption with celiac disease.   Adding on a TSH Pelvis US order placed.  Iron studies are pending. Will replenish if  needed  RTC 3 months APP, labs (CBC w/, CMP, iron, ferritin, retic, LDH, B12)  April Chestnut, PA-C 11/12/20242:56 PM

## 2023-01-25 ENCOUNTER — Telehealth (HOSPITAL_BASED_OUTPATIENT_CLINIC_OR_DEPARTMENT_OTHER): Payer: Self-pay

## 2023-01-25 LAB — IRON AND IRON BINDING CAPACITY (CC-WL,HP ONLY)
Iron: 84 ug/dL (ref 28–170)
Saturation Ratios: 28 % (ref 10.4–31.8)
TIBC: 300 ug/dL (ref 250–450)
UIBC: 216 ug/dL (ref 148–442)

## 2023-01-25 LAB — THYROID PANEL WITH TSH
Free Thyroxine Index: 2.1 (ref 1.2–4.9)
T3 Uptake Ratio: 25 % (ref 24–39)
T4, Total: 8.4 ug/dL (ref 4.5–12.0)
TSH: 0.95 u[IU]/mL (ref 0.450–4.500)

## 2023-01-28 ENCOUNTER — Ambulatory Visit (HOSPITAL_BASED_OUTPATIENT_CLINIC_OR_DEPARTMENT_OTHER)
Admission: RE | Admit: 2023-01-28 | Discharge: 2023-01-28 | Disposition: A | Discharge status: Home / Self Care | Payer: BC Managed Care – PPO | Source: Ambulatory Visit | Attending: Medical Oncology | Admitting: Medical Oncology

## 2023-01-28 DIAGNOSIS — N926 Irregular menstruation, unspecified: Secondary | ICD-10-CM | POA: Diagnosis not present

## 2023-02-13 ENCOUNTER — Inpatient Hospital Stay: Payer: BC Managed Care – PPO | Attending: Hematology & Oncology

## 2023-02-13 ENCOUNTER — Inpatient Hospital Stay (HOSPITAL_BASED_OUTPATIENT_CLINIC_OR_DEPARTMENT_OTHER): Payer: BC Managed Care – PPO | Admitting: Medical Oncology

## 2023-02-13 ENCOUNTER — Encounter: Payer: Self-pay | Admitting: Medical Oncology

## 2023-02-13 VITALS — BP 104/69 | HR 73 | Temp 97.8°F | Resp 18 | Ht 65.0 in | Wt 129.4 lb

## 2023-02-13 DIAGNOSIS — D508 Other iron deficiency anemias: Secondary | ICD-10-CM | POA: Diagnosis not present

## 2023-02-13 DIAGNOSIS — D509 Iron deficiency anemia, unspecified: Secondary | ICD-10-CM

## 2023-02-13 DIAGNOSIS — K900 Celiac disease: Secondary | ICD-10-CM | POA: Insufficient documentation

## 2023-02-13 DIAGNOSIS — D649 Anemia, unspecified: Secondary | ICD-10-CM

## 2023-02-13 DIAGNOSIS — E538 Deficiency of other specified B group vitamins: Secondary | ICD-10-CM | POA: Diagnosis not present

## 2023-02-13 DIAGNOSIS — K909 Intestinal malabsorption, unspecified: Secondary | ICD-10-CM | POA: Diagnosis not present

## 2023-02-13 LAB — IRON AND IRON BINDING CAPACITY (CC-WL,HP ONLY)
Iron: 140 ug/dL (ref 28–170)
Saturation Ratios: 52 % — ABNORMAL HIGH (ref 10.4–31.8)
TIBC: 270 ug/dL (ref 250–450)
UIBC: 130 ug/dL — ABNORMAL LOW (ref 148–442)

## 2023-02-13 LAB — FERRITIN: Ferritin: 42 ng/mL (ref 11–307)

## 2023-02-13 LAB — CBC WITH DIFFERENTIAL (CANCER CENTER ONLY)
Abs Immature Granulocytes: 0.02 10*3/uL (ref 0.00–0.07)
Basophils Absolute: 0.1 10*3/uL (ref 0.0–0.1)
Basophils Relative: 1 %
Eosinophils Absolute: 0.1 10*3/uL (ref 0.0–0.5)
Eosinophils Relative: 2 %
HCT: 41.1 % (ref 36.0–46.0)
Hemoglobin: 13.9 g/dL (ref 12.0–15.0)
Immature Granulocytes: 0 %
Lymphocytes Relative: 22 %
Lymphs Abs: 1.6 10*3/uL (ref 0.7–4.0)
MCH: 31.2 pg (ref 26.0–34.0)
MCHC: 33.8 g/dL (ref 30.0–36.0)
MCV: 92.2 fL (ref 80.0–100.0)
Monocytes Absolute: 0.3 10*3/uL (ref 0.1–1.0)
Monocytes Relative: 4 %
Neutro Abs: 4.9 10*3/uL (ref 1.7–7.7)
Neutrophils Relative %: 71 %
Platelet Count: 224 10*3/uL (ref 150–400)
RBC: 4.46 MIL/uL (ref 3.87–5.11)
RDW: 12.3 % (ref 11.5–15.5)
WBC Count: 7 10*3/uL (ref 4.0–10.5)
nRBC: 0 % (ref 0.0–0.2)

## 2023-02-13 LAB — RETIC PANEL
Immature Retic Fract: 8.2 % (ref 2.3–15.9)
RBC.: 4.48 MIL/uL (ref 3.87–5.11)
Retic Count, Absolute: 63.6 10*3/uL (ref 19.0–186.0)
Retic Ct Pct: 1.4 % (ref 0.4–3.1)
Reticulocyte Hemoglobin: 34.7 pg (ref >27.9)

## 2023-02-13 LAB — CMP (CANCER CENTER ONLY)
ALT: 14 U/L (ref 0–44)
AST: 13 U/L — ABNORMAL LOW (ref 15–41)
Albumin: 4.2 g/dL (ref 3.5–5.0)
Alkaline Phosphatase: 50 U/L (ref 38–126)
Anion gap: 9 (ref 5–15)
BUN: 10 mg/dL (ref 6–20)
CO2: 26 mmol/L (ref 22–32)
Calcium: 9.2 mg/dL (ref 8.9–10.3)
Chloride: 106 mmol/L (ref 98–111)
Creatinine: 0.85 mg/dL (ref 0.44–1.00)
GFR, Estimated: >60 mL/min (ref >60)
Glucose, Bld: 86 mg/dL (ref 70–99)
Potassium: 3.8 mmol/L (ref 3.5–5.1)
Sodium: 141 mmol/L (ref 135–145)
Total Bilirubin: 0.8 mg/dL (ref <1.2)
Total Protein: 6.7 g/dL (ref 6.5–8.1)

## 2023-02-13 LAB — PHOSPHORUS: Phosphorus: 2.6 mg/dL (ref 2.5–4.6)

## 2023-02-13 LAB — VITAMIN B12: Vitamin B-12: 181 pg/mL (ref 180–914)

## 2023-02-13 LAB — FOLATE: Folate: 8 ng/mL (ref >5.9)

## 2023-02-13 NOTE — Progress Notes (Signed)
Hematology and Oncology Follow Up Visit  April Moon [id number] [birth year] 42 y.o. 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 There may have been an dose increase of her Topamax through a potential pharmacy error. She will look into this.  Recent Colonoscopy was negative and EGD only showed localized mildly erythematous mucosa without bleeding in the gastric antrum. She is taking Prilosec BID.  Menstrual cycles are irregular. Now about 5 days. GYN saw her in Jan. She has not had a pelvic US since pregnancy 5 years ago.  No other blood loss noted. No abnormal bruising, no petechiae.  No fever, chills, n/v, cough, rash, dizziness, SOB, chest pain, palpitations or changes in bowel or bladder habits.  She has intermittent abdominal pain.  No swelling in her extremities.  No falls or syncope.  Appetite and hydration are a bit lower  Wt Readings from Last 3 Encounters:  02/13/23 129 lb 6.4 oz (58.7 kg)  01/24/23 128 lb 1.3 oz (58.1 kg)  12/23/22 136 lb (61.7 kg)   ECOG Performance Status: 1 - Symptomatic but completely ambulatory  Medications:  Allergies as of 02/13/2023       Reactions   Chicken Allergy Diarrhea, Nausea And Vomiting   Egg-derived Products Diarrhea, Nausea And Vomiting   Gluten Meal Diarrhea, Nausea And Vomiting   Sulfa Antibiotics Hives, Nausea And Vomiting, Other (See Comments)   Dizziness        Medication List        Accurate as of February 13, 2023  9:49 AM. If you have any questions, ask your nurse or doctor.          STOP taking these medications    spironolactone 50 MG tablet Commonly known as: ALDACTONE Stopped by: Rushie Chestnut        TAKE these medications    albuterol 108 (90 Base) MCG/ACT inhaler Commonly known as: VENTOLIN HFA Inhale 1-2 puffs into the lungs every 6 (six) hours as needed for wheezing or shortness of breath.   amphetamine-dextroamphetamine 10 MG 24 hr capsule Commonly known as: ADDERALL XR Take 10 mg by mouth daily.   amphetamine-dextroamphetamine 5 MG tablet Commonly known as: ADDERALL Take 5 mg by mouth daily.   buPROPion 150 MG 24 hr tablet Commonly known as: WELLBUTRIN XL Take 150 mg by mouth daily.   dicyclomine 10 MG capsule Commonly known as: BENTYL Take 1 capsule (10 mg total) by mouth 3 (three) times daily as needed.   FLUoxetine 20 MG capsule Commonly known as: PROZAC Take 40 mg by mouth daily.   LORazepam 0.5 MG tablet Commonly known as: ATIVAN Take 0.5 mg by mouth as directed.   omeprazole 40 MG capsule Commonly known as: PRILOSEC Take 1 capsule (40 mg total) by mouth daily. Take for 10 weeks   rizatriptan 10 MG tablet Commonly known as: MAXALT Take 1 tablet by mouth as needed.   topiramate 25 MG tablet Commonly known as: TOPAMAX Take 25 mg by mouth as directed.   topiramate 50 MG tablet Commonly known as: TOPAMAX Take 50 mg by mouth 2 (two) times daily.   traZODone 50 MG tablet Commonly known as: DESYREL  Take 50 mg by mouth at bedtime as needed for sleep.        Allergies:  Allergies  Allergen Reactions   Chicken Allergy Diarrhea and Nausea And Vomiting   Egg-Derived Products Diarrhea and Nausea And Vomiting   Gluten Meal Diarrhea and Nausea And Vomiting   Sulfa Antibiotics Hives, Nausea And Vomiting and Other (See Comments)    Dizziness    Past Medical History, Surgical history, Social history, and Family History were reviewed and updated.  Review of Systems: All other 10 point review of systems is negative.   Physical Exam:  height is 5\' 5"  (1.651 m) and weight is 129 lb 6.4 oz (58.7 kg). Her oral temperature is 97.8 F (36.6 C). Her  blood pressure is 104/69 and her pulse is 73. Her respiration is 18 and oxygen saturation is 100%.   Wt Readings from Last 3 Encounters:  02/13/23 129 lb 6.4 oz (58.7 kg)  01/24/23 128 lb 1.3 oz (58.1 kg)  12/23/22 136 lb (61.7 kg)    Ocular: Sclerae unicteric, pupils equal, round and reactive to light Ear-nose-throat: Oropharynx clear, dentition fair Lymphatic: No cervical or supraclavicular adenopathy Lungs no rales or rhonchi, good excursion bilaterally Heart regular rate and rhythm, no murmur appreciated Abd soft, nontender, positive bowel sounds MSK no focal spinal tenderness, no joint edema Neuro: non-focal, well-oriented, appropriate affect   Lab Results  Component Value Date   WBC 7.0 02/13/2023   HGB 13.9 02/13/2023   HCT 41.1 02/13/2023   MCV 92.2 02/13/2023   PLT 224 02/13/2023   Lab Results  Component Value Date   FERRITIN 51 01/24/2023   IRON 84 01/24/2023   TIBC 300 01/24/2023   UIBC 216 01/24/2023   IRONPCTSAT 28 01/24/2023   Lab Results  Component Value Date   RETICCTPCT 1.4 02/13/2023   RBC 4.48 02/13/2023   No results found for: "KPAFRELGTCHN", "LAMBDASER", "KAPLAMBRATIO" No results found for: "IGGSERUM", "IGA", "IGMSERUM" No results found for: "TOTALPROTELP", "ALBUMINELP", "A1GS", "A2GS", "BETS", "BETA2SER", "GAMS", "MSPIKE", "SPEI"   Chemistry      Component Value Date/Time   NA 141 02/13/2023 0906   K 3.8 02/13/2023 0906   CL 106 02/13/2023 0906   CO2 26 02/13/2023 0906   BUN 10 02/13/2023 0906   CREATININE 0.85 02/13/2023 0906      Component Value Date/Time   CALCIUM 9.2 02/13/2023 0906   ALKPHOS 50 02/13/2023 0906   AST 13 (L) 02/13/2023 0906   ALT 14 02/13/2023 0906   BILITOT 0.8 02/13/2023 0906     Encounter Diagnosis  Name Primary?   Iron deficiency anemia, unspecified iron deficiency anemia type Yes    Impression and Plan: April Moon is a very pleasant 42 yo caucasian female with iron deficiency anemia secondary to cycle and  malabsorption with celiac disease.   She will look into Topamax dose- increase could cause her brain fog.  Will add on a B12 and Phosphorous to see if these are low as well. She will start a Vitamin D supplement 1000 I.U. once daily as well to help with symptoms. We had a long discussion that I did not think that her brain fog and fatigue is due to iron at this time based off of her last iron studies and CBC.  Iron studies are pending. Will replenish if needed  RTC 3 months APP, labs (CBC w/, CMP, iron, ferritin, retic, LDH, B12)-Allisonia  Rushie Chestnut, PA-C 12/2/20249:49 AM

## 2023-02-14 ENCOUNTER — Other Ambulatory Visit: Payer: Self-pay | Admitting: Medical Oncology

## 2023-02-16 ENCOUNTER — Inpatient Hospital Stay: Payer: BC Managed Care – PPO

## 2023-02-16 VITALS — BP 112/81 | HR 71 | Temp 97.7°F | Resp 17

## 2023-02-16 DIAGNOSIS — K909 Intestinal malabsorption, unspecified: Secondary | ICD-10-CM | POA: Diagnosis not present

## 2023-02-16 DIAGNOSIS — K900 Celiac disease: Secondary | ICD-10-CM | POA: Diagnosis not present

## 2023-02-16 DIAGNOSIS — D508 Other iron deficiency anemias: Secondary | ICD-10-CM | POA: Diagnosis not present

## 2023-02-16 DIAGNOSIS — D509 Iron deficiency anemia, unspecified: Secondary | ICD-10-CM

## 2023-02-16 DIAGNOSIS — E538 Deficiency of other specified B group vitamins: Secondary | ICD-10-CM | POA: Diagnosis not present

## 2023-02-16 MED ORDER — CYANOCOBALAMIN 1000 MCG/ML IJ SOLN
1000.0000 ug | Freq: Once | INTRAMUSCULAR | Status: AC
  Administered 2023-02-16: 1000 ug via INTRAMUSCULAR
  Filled 2023-02-16: qty 1

## 2023-02-16 NOTE — Patient Instructions (Signed)
 Vitamin B12 Injection What is this medication? Vitamin B12 (VAHY tuh min B12) prevents and treats low vitamin B12 levels in your body. It is used in people who do not get enough vitamin B12 from their diet or when their digestive tract does not absorb enough. Vitamin B12 plays an important role in maintaining the health of your nervous system and red blood cells. This medicine may be used for other purposes; ask your health care provider or pharmacist if you have questions. COMMON BRAND NAME(S): B-12 Compliance Kit, B-12 Injection Kit, Cyomin, Dodex, LA-12, Nutri-Twelve, Physicians EZ Use B-12, Primabalt, Vitamin Deficiency Injectable System - B12 What should I tell my care team before I take this medication? They need to know if you have any of these conditions: Kidney disease Leber's disease Megaloblastic anemia An unusual or allergic reaction to cyanocobalamin, cobalt, other medications, foods, dyes, or preservatives Pregnant or trying to get pregnant Breast-feeding How should I use this medication? This medication is injected into a muscle or deeply under the skin. It is usually given in a clinic or care team's office. However, your care team may teach you how to inject yourself. Follow all instructions. Talk to your care team about the use of this medication in children. Special care may be needed. Overdosage: If you think you have taken too much of this medicine contact a poison control center or emergency room at once. NOTE: This medicine is only for you. Do not share this medicine with others. What if I miss a dose? If you are given your dose at a clinic or care team's office, call to reschedule your appointment. If you give your own injections, and you miss a dose, take it as soon as you can. If it is almost time for your next dose, take only that dose. Do not take double or extra doses. What may interact with this medication? Alcohol Colchicine This list may not describe all possible  interactions. Give your health care provider a list of all the medicines, herbs, non-prescription drugs, or dietary supplements you use. Also tell them if you smoke, drink alcohol, or use illegal drugs. Some items may interact with your medicine. What should I watch for while using this medication? Visit your care team regularly. You may need blood work done while you are taking this medication. You may need to follow a special diet. Talk to your care team. Limit your alcohol intake and avoid smoking to get the best benefit. What side effects may I notice from receiving this medication? Side effects that you should report to your care team as soon as possible: Allergic reactions--skin rash, itching, hives, swelling of the face, lips, tongue, or throat Swelling of the ankles, hands, or feet Trouble breathing Side effects that usually do not require medical attention (report to your care team if they continue or are bothersome): Diarrhea This list may not describe all possible side effects. Call your doctor for medical advice about side effects. You may report side effects to FDA at 1-800-FDA-1088. Where should I keep my medication? Keep out of the reach of children. Store at room temperature between 15 and 30 degrees C (59 and 85 degrees F). Protect from light. Throw away any unused medication after the expiration date. NOTE: This sheet is a summary. It may not cover all possible information. If you have questions about this medicine, talk to your doctor, pharmacist, or health care provider.  2024 Elsevier/Gold Standard (2020-11-10 00:00:00)

## 2023-02-23 ENCOUNTER — Inpatient Hospital Stay: Payer: BC Managed Care – PPO

## 2023-02-23 VITALS — BP 112/81 | HR 55 | Temp 98.0°F | Resp 18

## 2023-02-23 DIAGNOSIS — E538 Deficiency of other specified B group vitamins: Secondary | ICD-10-CM | POA: Diagnosis not present

## 2023-02-23 DIAGNOSIS — K909 Intestinal malabsorption, unspecified: Secondary | ICD-10-CM | POA: Diagnosis not present

## 2023-02-23 DIAGNOSIS — D508 Other iron deficiency anemias: Secondary | ICD-10-CM | POA: Diagnosis not present

## 2023-02-23 DIAGNOSIS — K900 Celiac disease: Secondary | ICD-10-CM | POA: Diagnosis not present

## 2023-02-23 DIAGNOSIS — D509 Iron deficiency anemia, unspecified: Secondary | ICD-10-CM

## 2023-02-23 MED ORDER — CYANOCOBALAMIN 1000 MCG/ML IJ SOLN
1000.0000 ug | Freq: Once | INTRAMUSCULAR | Status: AC
  Administered 2023-02-23: 1000 ug via INTRAMUSCULAR
  Filled 2023-02-23: qty 1

## 2023-02-23 NOTE — Patient Instructions (Signed)
 Vitamin B12 Injection What is this medication? Vitamin B12 (VAHY tuh min B12) prevents and treats low vitamin B12 levels in your body. It is used in people who do not get enough vitamin B12 from their diet or when their digestive tract does not absorb enough. Vitamin B12 plays an important role in maintaining the health of your nervous system and red blood cells. This medicine may be used for other purposes; ask your health care provider or pharmacist if you have questions. COMMON BRAND NAME(S): B-12 Compliance Kit, B-12 Injection Kit, Cyomin, Dodex, LA-12, Nutri-Twelve, Physicians EZ Use B-12, Primabalt, Vitamin Deficiency Injectable System - B12 What should I tell my care team before I take this medication? They need to know if you have any of these conditions: Kidney disease Leber's disease Megaloblastic anemia An unusual or allergic reaction to cyanocobalamin, cobalt, other medications, foods, dyes, or preservatives Pregnant or trying to get pregnant Breast-feeding How should I use this medication? This medication is injected into a muscle or deeply under the skin. It is usually given in a clinic or care team's office. However, your care team may teach you how to inject yourself. Follow all instructions. Talk to your care team about the use of this medication in children. Special care may be needed. Overdosage: If you think you have taken too much of this medicine contact a poison control center or emergency room at once. NOTE: This medicine is only for you. Do not share this medicine with others. What if I miss a dose? If you are given your dose at a clinic or care team's office, call to reschedule your appointment. If you give your own injections, and you miss a dose, take it as soon as you can. If it is almost time for your next dose, take only that dose. Do not take double or extra doses. What may interact with this medication? Alcohol Colchicine This list may not describe all possible  interactions. Give your health care provider a list of all the medicines, herbs, non-prescription drugs, or dietary supplements you use. Also tell them if you smoke, drink alcohol, or use illegal drugs. Some items may interact with your medicine. What should I watch for while using this medication? Visit your care team regularly. You may need blood work done while you are taking this medication. You may need to follow a special diet. Talk to your care team. Limit your alcohol intake and avoid smoking to get the best benefit. What side effects may I notice from receiving this medication? Side effects that you should report to your care team as soon as possible: Allergic reactions--skin rash, itching, hives, swelling of the face, lips, tongue, or throat Swelling of the ankles, hands, or feet Trouble breathing Side effects that usually do not require medical attention (report to your care team if they continue or are bothersome): Diarrhea This list may not describe all possible side effects. Call your doctor for medical advice about side effects. You may report side effects to FDA at 1-800-FDA-1088. Where should I keep my medication? Keep out of the reach of children. Store at room temperature between 15 and 30 degrees C (59 and 85 degrees F). Protect from light. Throw away any unused medication after the expiration date. NOTE: This sheet is a summary. It may not cover all possible information. If you have questions about this medicine, talk to your doctor, pharmacist, or health care provider.  2024 Elsevier/Gold Standard (2020-11-10 00:00:00)

## 2023-02-27 DIAGNOSIS — F419 Anxiety disorder, unspecified: Secondary | ICD-10-CM | POA: Diagnosis not present

## 2023-02-27 DIAGNOSIS — F900 Attention-deficit hyperactivity disorder, predominantly inattentive type: Secondary | ICD-10-CM | POA: Diagnosis not present

## 2023-02-27 DIAGNOSIS — F331 Major depressive disorder, recurrent, moderate: Secondary | ICD-10-CM | POA: Diagnosis not present

## 2023-03-02 ENCOUNTER — Inpatient Hospital Stay: Payer: BC Managed Care – PPO

## 2023-03-03 ENCOUNTER — Ambulatory Visit: Payer: BC Managed Care – PPO | Admitting: Internal Medicine

## 2023-03-03 ENCOUNTER — Ambulatory Visit: Payer: BC Managed Care – PPO | Admitting: Gastroenterology

## 2023-03-09 ENCOUNTER — Inpatient Hospital Stay: Payer: BC Managed Care – PPO

## 2023-03-09 VITALS — BP 108/67 | HR 70 | Temp 98.2°F | Resp 17

## 2023-03-09 DIAGNOSIS — K909 Intestinal malabsorption, unspecified: Secondary | ICD-10-CM | POA: Diagnosis not present

## 2023-03-09 DIAGNOSIS — E538 Deficiency of other specified B group vitamins: Secondary | ICD-10-CM | POA: Diagnosis not present

## 2023-03-09 DIAGNOSIS — K900 Celiac disease: Secondary | ICD-10-CM | POA: Diagnosis not present

## 2023-03-09 DIAGNOSIS — D508 Other iron deficiency anemias: Secondary | ICD-10-CM | POA: Diagnosis not present

## 2023-03-09 DIAGNOSIS — D509 Iron deficiency anemia, unspecified: Secondary | ICD-10-CM

## 2023-03-09 MED ORDER — CYANOCOBALAMIN 1000 MCG/ML IJ SOLN
1000.0000 ug | Freq: Once | INTRAMUSCULAR | Status: AC
  Administered 2023-03-09: 1000 ug via INTRAMUSCULAR
  Filled 2023-03-09: qty 1

## 2023-03-09 NOTE — Patient Instructions (Signed)
 Vitamin B12 Injection What is this medication? Vitamin B12 (VAHY tuh min B12) prevents and treats low vitamin B12 levels in your body. It is used in people who do not get enough vitamin B12 from their diet or when their digestive tract does not absorb enough. Vitamin B12 plays an important role in maintaining the health of your nervous system and red blood cells. This medicine may be used for other purposes; ask your health care provider or pharmacist if you have questions. COMMON BRAND NAME(S): B-12 Compliance Kit, B-12 Injection Kit, Cyomin, Dodex, LA-12, Nutri-Twelve, Physicians EZ Use B-12, Primabalt, Vitamin Deficiency Injectable System - B12 What should I tell my care team before I take this medication? They need to know if you have any of these conditions: Kidney disease Leber's disease Megaloblastic anemia An unusual or allergic reaction to cyanocobalamin, cobalt, other medications, foods, dyes, or preservatives Pregnant or trying to get pregnant Breast-feeding How should I use this medication? This medication is injected into a muscle or deeply under the skin. It is usually given in a clinic or care team's office. However, your care team may teach you how to inject yourself. Follow all instructions. Talk to your care team about the use of this medication in children. Special care may be needed. Overdosage: If you think you have taken too much of this medicine contact a poison control center or emergency room at once. NOTE: This medicine is only for you. Do not share this medicine with others. What if I miss a dose? If you are given your dose at a clinic or care team's office, call to reschedule your appointment. If you give your own injections, and you miss a dose, take it as soon as you can. If it is almost time for your next dose, take only that dose. Do not take double or extra doses. What may interact with this medication? Alcohol Colchicine This list may not describe all possible  interactions. Give your health care provider a list of all the medicines, herbs, non-prescription drugs, or dietary supplements you use. Also tell them if you smoke, drink alcohol, or use illegal drugs. Some items may interact with your medicine. What should I watch for while using this medication? Visit your care team regularly. You may need blood work done while you are taking this medication. You may need to follow a special diet. Talk to your care team. Limit your alcohol intake and avoid smoking to get the best benefit. What side effects may I notice from receiving this medication? Side effects that you should report to your care team as soon as possible: Allergic reactions--skin rash, itching, hives, swelling of the face, lips, tongue, or throat Swelling of the ankles, hands, or feet Trouble breathing Side effects that usually do not require medical attention (report to your care team if they continue or are bothersome): Diarrhea This list may not describe all possible side effects. Call your doctor for medical advice about side effects. You may report side effects to FDA at 1-800-FDA-1088. Where should I keep my medication? Keep out of the reach of children. Store at room temperature between 15 and 30 degrees C (59 and 85 degrees F). Protect from light. Throw away any unused medication after the expiration date. NOTE: This sheet is a summary. It may not cover all possible information. If you have questions about this medicine, talk to your doctor, pharmacist, or health care provider.  2024 Elsevier/Gold Standard (2020-11-10 00:00:00)

## 2023-03-16 ENCOUNTER — Inpatient Hospital Stay: Payer: BC Managed Care – PPO | Attending: Hematology & Oncology

## 2023-03-20 IMAGING — DX DG CHEST 1V PORT
1 series · 1 of 1 positions shown · non-contrast
Comparison: 05/21/2017

CLINICAL DATA: COVID positive.  Shortness of breath.

EXAM:
PORTABLE CHEST 1 VIEW

[chest ap]
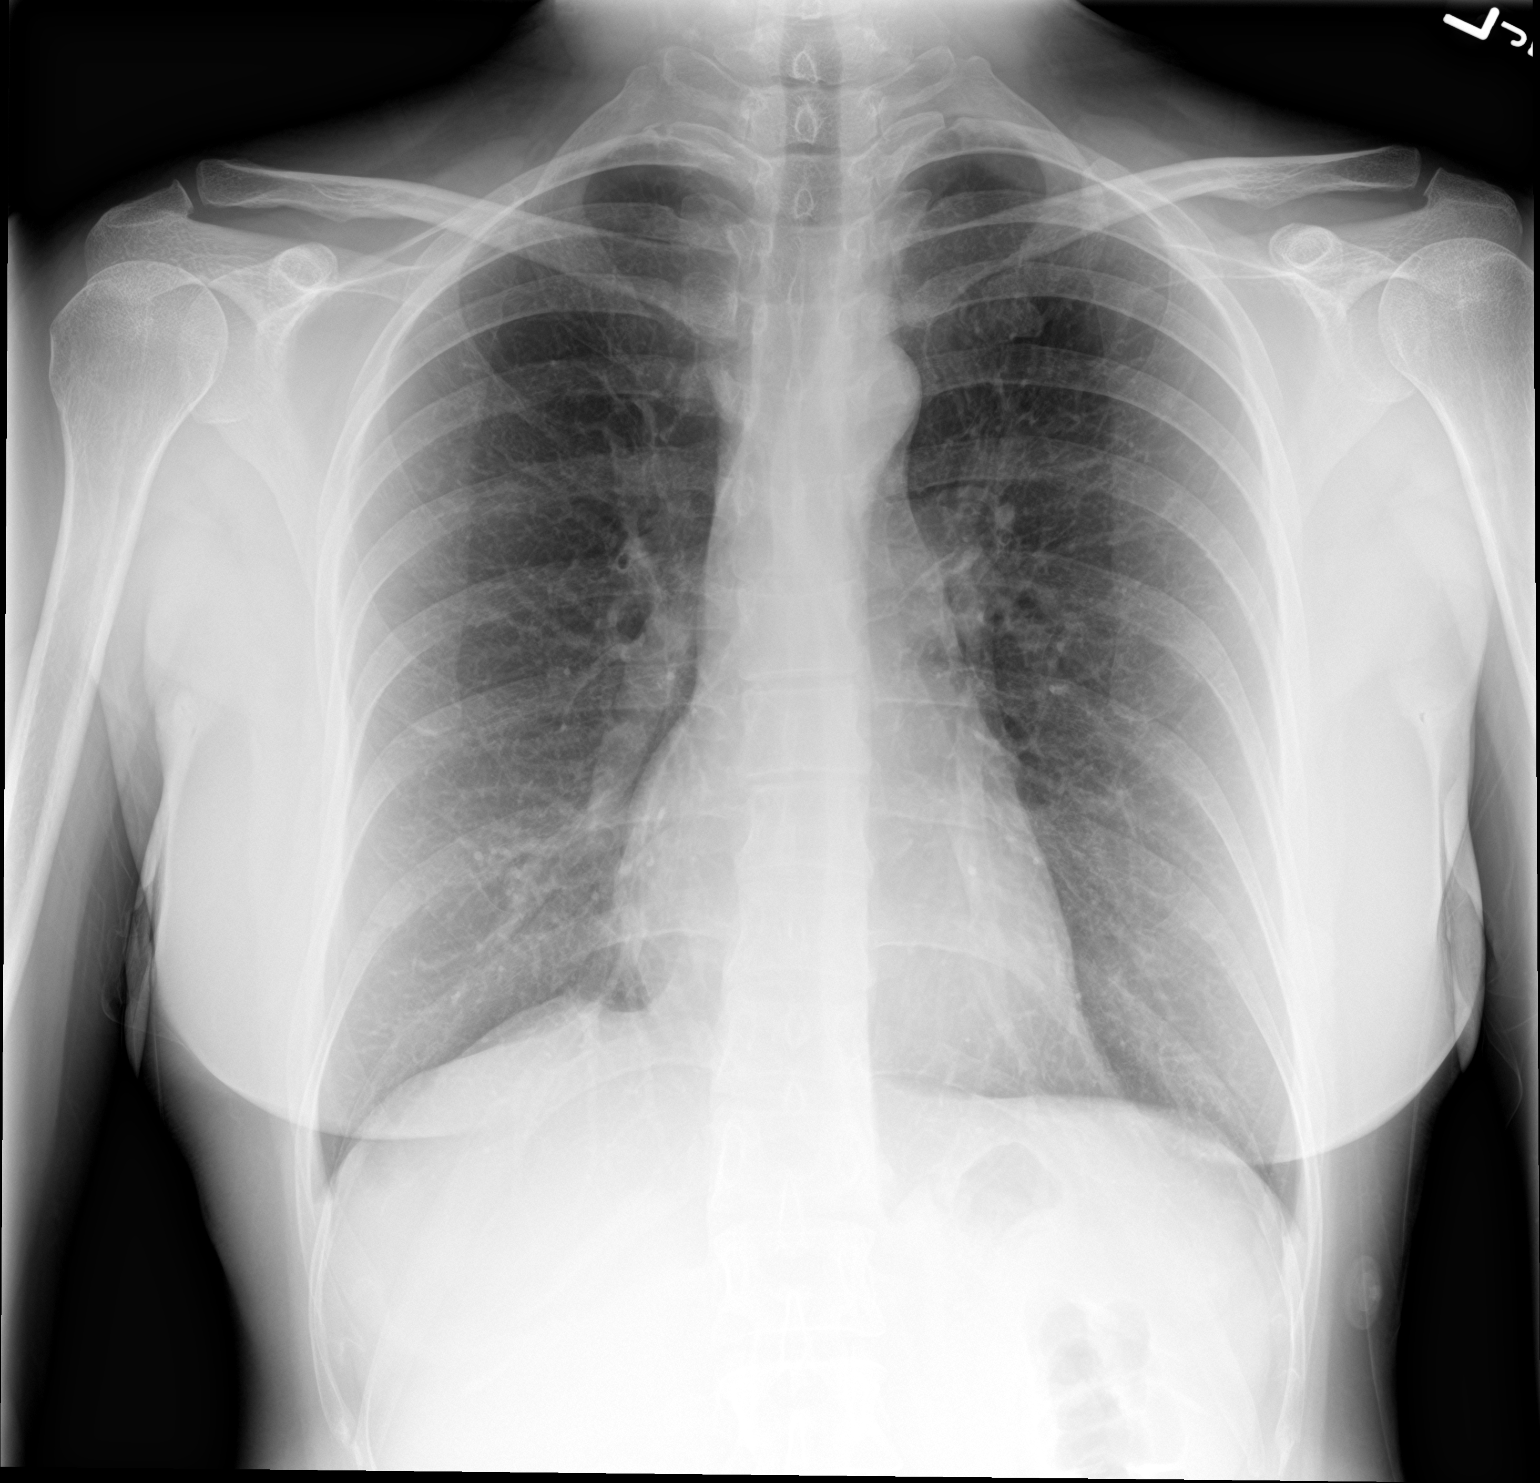

[1 of 1 positions shown; findings below may reference images not displayed]

FINDINGS: The lungs are clear without focal pneumonia, edema, pneumothorax or
pleural effusion. The cardiopericardial silhouette is within normal
limits for size. The visualized bony structures of the thorax show
no acute abnormality.
IMPRESSION: No active disease.

## 2023-03-22 DIAGNOSIS — F331 Major depressive disorder, recurrent, moderate: Secondary | ICD-10-CM | POA: Diagnosis not present

## 2023-03-22 DIAGNOSIS — F419 Anxiety disorder, unspecified: Secondary | ICD-10-CM | POA: Diagnosis not present

## 2023-03-22 DIAGNOSIS — F900 Attention-deficit hyperactivity disorder, predominantly inattentive type: Secondary | ICD-10-CM | POA: Diagnosis not present

## 2023-03-23 DIAGNOSIS — N926 Irregular menstruation, unspecified: Secondary | ICD-10-CM | POA: Diagnosis not present

## 2023-04-13 ENCOUNTER — Inpatient Hospital Stay: Payer: BC Managed Care – PPO

## 2023-04-15 ENCOUNTER — Ambulatory Visit
Admission: EM | Admit: 2023-04-15 | Discharge: 2023-04-15 | Disposition: A | Discharge status: Home / Self Care | Payer: BC Managed Care – PPO | Attending: Family Medicine | Admitting: Family Medicine

## 2023-04-15 DIAGNOSIS — J101 Influenza due to other identified influenza virus with other respiratory manifestations: Secondary | ICD-10-CM | POA: Diagnosis not present

## 2023-04-15 DIAGNOSIS — J4530 Mild persistent asthma, uncomplicated: Secondary | ICD-10-CM | POA: Diagnosis not present

## 2023-04-15 LAB — POCT INFLUENZA A/B
Influenza A, POC: POSITIVE — AB
Influenza B, POC: NEGATIVE

## 2023-04-15 MED ORDER — ACETAMINOPHEN 325 MG PO TABS
650.0000 mg | ORAL_TABLET | Freq: Once | ORAL | Status: AC
  Administered 2023-04-15: 650 mg via ORAL

## 2023-04-15 MED ORDER — BENZONATATE 100 MG PO CAPS: 100.0000 mg | ORAL_CAPSULE | Freq: Three times a day (TID) | ORAL | 0 refills | Status: DC | PRN

## 2023-04-15 MED ORDER — PREDNISONE 20 MG PO TABS: ORAL_TABLET | ORAL | 0 refills | Status: DC

## 2023-04-15 MED ORDER — OSELTAMIVIR PHOSPHATE 75 MG PO CAPS: 75.0000 mg | ORAL_CAPSULE | Freq: Two times a day (BID) | ORAL | 0 refills | Status: DC

## 2023-04-15 MED ORDER — ALBUTEROL SULFATE HFA 108 (90 BASE) MCG/ACT IN AERS: 1.0000 | INHALATION_SPRAY | Freq: Four times a day (QID) | RESPIRATORY_TRACT | 1 refills | Status: AC | PRN

## 2023-04-15 NOTE — ED Provider Notes (Signed)
[facility]  Note:  This document was prepared using Conservation officer, historic buildings and may include unintentional dictation errors.  MRN: [id number] DOB: Jan 26, 1981  Subjective:   April Moon is a 43 y.o. female presenting for 2 day history of acute onset malaise, body aches, fever, runny and stuffy nose, headaches. Her kids have influenza A.  Has reactive airways, needs a refill on her inhaler.  No smoking of any kind including cigarettes, cigars, vaping, marijuana use.  Typically does well with steroids.  No current facility-administered medications for this encounter.  Current Outpatient Medications:    albuterol (VENTOLIN HFA) 108 (90 Base) MCG/ACT inhaler, Inhale 1-2 puffs into the lungs every 6 (six) hours as needed for wheezing or shortness of breath., Disp: 8.5 g, Rfl: 1   amphetamine-dextroamphetamine (ADDERALL XR) 10 MG 24 hr capsule, Take 10 mg by mouth daily., Disp: , Rfl:    amphetamine-dextroamphetamine (ADDERALL) 5 MG tablet, Take 5 mg by mouth daily., Disp: , Rfl:    buPROPion (WELLBUTRIN XL) 150 MG 24 hr tablet, Take 150 mg by mouth daily., Disp: , Rfl:    dicyclomine (BENTYL) 10 MG capsule, Take 1 capsule (10 mg total) by mouth 3 (three) times daily as needed., Disp: 90 capsule, Rfl: 0   FLUoxetine (PROZAC) 20 MG capsule, Take 40 mg by mouth daily., Disp: , Rfl:    LORazepam (ATIVAN) 0.5 MG tablet, Take 0.5 mg by mouth as directed., Disp: , Rfl:    omeprazole (PRILOSEC) 40 MG capsule, Take 1 capsule (40 mg total) by mouth daily. Take for 10 weeks, Disp: 30 capsule, Rfl: 2   rizatriptan (MAXALT) 10 MG tablet, Take 1 tablet by mouth as needed., Disp: , Rfl:    topiramate (TOPAMAX) 25 MG tablet, Take 25 mg by mouth as directed., Disp: , Rfl:    topiramate (TOPAMAX) 50 MG tablet, Take 50 mg by mouth 2 (two) times daily., Disp: , Rfl:    traZODone (DESYREL) 50 MG tablet, Take 50 mg by mouth at bedtime as needed for sleep., Disp: , Rfl:     Allergies  Allergen Reactions   Chicken Allergy Diarrhea and Nausea And Vomiting   Egg-Derived Products Diarrhea and Nausea And Vomiting   Gluten Meal Diarrhea and Nausea And Vomiting   Sulfa Antibiotics Hives, Nausea And Vomiting and Other (See Comments)    Dizziness    Past Medical History:  Diagnosis Date   ADD (attention deficit disorder)    Allergy    Anemia    Anxiety    Carpal tunnel syndrome, right    Celiac disease    Chicken pox    Depression    prozac   GERD (gastroesophageal reflux disease)    Hirsutism    Migraine      Past Surgical History:  Procedure Laterality Date   COLONOSCOPY     LASIK  2006   TONSILLECTOMY     UPPER GASTROINTESTINAL ENDOSCOPY     VAGINAL DELIVERY     x2    Family History  Problem Relation Age of Onset   Autoimmune disease Mother        Langerhan's cell histiocytosis   Barrett's esophagus Father    Breast cancer Maternal Aunt    Colon cancer Maternal Aunt    Esophageal cancer Maternal Grandmother    Breast cancer Maternal Grandmother    Breast cancer Paternal Grandmother    Stomach cancer Paternal Grandmother    Ovarian cancer Paternal Grandmother    Rectal  cancer Neg Hx     Social History   Tobacco Use   Smoking status: Never   Smokeless tobacco: Never  Vaping Use   Vaping status: Never Used  Substance Use Topics   Alcohol use: Yes    Comment: occ   Drug use: No    ROS   Objective:   Vitals: BP 108/72 (BP Location: Left Arm)   Pulse (!) 121   Temp (!) 102.5 F (39.2 C) (Oral)   Resp 20   LMP  (Within Months) Comment: 1 month  SpO2 96%   Breastfeeding No   Physical Exam Constitutional:      General: She is not in acute distress.    Appearance: Normal appearance. She is well-developed. She is not ill-appearing, toxic-appearing or diaphoretic.  HENT:     Head: Normocephalic and atraumatic.     Nose: Nose normal.     Mouth/Throat:     Mouth: Mucous membranes are moist.  Eyes:     General: No  scleral icterus.       Right eye: No discharge.        Left eye: No discharge.     Extraocular Movements: Extraocular movements intact.  Cardiovascular:     Rate and Rhythm: Normal rate and regular rhythm.     Heart sounds: Normal heart sounds. No murmur heard.    No friction rub. No gallop.  Pulmonary:     Effort: Pulmonary effort is normal. No respiratory distress.     Breath sounds: No stridor. No wheezing, rhonchi or rales.  Chest:     Chest wall: No tenderness.  Skin:    General: Skin is warm and dry.  Neurological:     General: No focal deficit present.     Mental Status: She is alert and oriented to person, place, and time.  Psychiatric:        Mood and Affect: Mood normal.        Behavior: Behavior normal.     Assessment and Plan :   PDMP not reviewed this encounter.  1. Influenza A   2. Mild persistent reactive airway disease without complication     Deferred imaging given clear cardiopulmonary exam, hemodynamically stable vital signs. Will cover for influenza with Tamiflu given exposure, symptom set, current incidence in the community.  Use supportive care, rest, fluids, hydration, light meals, schedule Tylenol.  Recommended prednisone for her breathing given her reactive airways.  Counseled patient on potential for adverse effects with medications prescribed today, patient verbalized understanding. ER and return-to-clinic precautions discussed, patient verbalized understanding.    Wallis Bamberg, [place] 04/15/23 1259

## 2023-04-15 NOTE — ED Triage Notes (Signed)
Pt reports body aches, headache, cough, runny nose, congestion x 2 days. Reports her kids has Flu A. Taking Tylenol and Dayquil.

## 2023-04-27 ENCOUNTER — Encounter: Payer: Self-pay | Admitting: Medical Oncology

## 2023-04-27 ENCOUNTER — Inpatient Hospital Stay: Payer: BC Managed Care – PPO | Attending: Hematology & Oncology

## 2023-04-27 ENCOUNTER — Inpatient Hospital Stay (HOSPITAL_BASED_OUTPATIENT_CLINIC_OR_DEPARTMENT_OTHER): Payer: BC Managed Care – PPO | Admitting: Medical Oncology

## 2023-04-27 ENCOUNTER — Inpatient Hospital Stay: Payer: BC Managed Care – PPO

## 2023-04-27 VITALS — BP 99/55 | HR 69 | Temp 98.1°F | Resp 18 | Ht 65.0 in | Wt 127.0 lb

## 2023-04-27 DIAGNOSIS — D509 Iron deficiency anemia, unspecified: Secondary | ICD-10-CM

## 2023-04-27 DIAGNOSIS — R7989 Other specified abnormal findings of blood chemistry: Secondary | ICD-10-CM | POA: Diagnosis not present

## 2023-04-27 DIAGNOSIS — K900 Celiac disease: Secondary | ICD-10-CM

## 2023-04-27 DIAGNOSIS — E538 Deficiency of other specified B group vitamins: Secondary | ICD-10-CM | POA: Diagnosis not present

## 2023-04-27 LAB — CMP (CANCER CENTER ONLY)
ALT: 26 U/L (ref 0–44)
AST: 18 U/L (ref 15–41)
Albumin: 4.1 g/dL (ref 3.5–5.0)
Alkaline Phosphatase: 57 U/L (ref 38–126)
Anion gap: 5 (ref 5–15)
BUN: 7 mg/dL (ref 6–20)
CO2: 30 mmol/L (ref 22–32)
Calcium: 9.1 mg/dL (ref 8.9–10.3)
Chloride: 106 mmol/L (ref 98–111)
Creatinine: 0.73 mg/dL (ref 0.44–1.00)
GFR, Estimated: >60 mL/min (ref >60)
Glucose, Bld: 89 mg/dL (ref 70–99)
Potassium: 4 mmol/L (ref 3.5–5.1)
Sodium: 141 mmol/L (ref 135–145)
Total Bilirubin: 0.6 mg/dL (ref 0.0–1.2)
Total Protein: 6.2 g/dL — ABNORMAL LOW (ref 6.5–8.1)

## 2023-04-27 LAB — FERRITIN: Ferritin: 54 ng/mL (ref 11–307)

## 2023-04-27 LAB — CBC WITH DIFFERENTIAL (CANCER CENTER ONLY)
Abs Immature Granulocytes: 0.01 10*3/uL (ref 0.00–0.07)
Basophils Absolute: 0 10*3/uL (ref 0.0–0.1)
Basophils Relative: 1 %
Eosinophils Absolute: 0.2 10*3/uL (ref 0.0–0.5)
Eosinophils Relative: 2 %
HCT: 41.9 % (ref 36.0–46.0)
Hemoglobin: 14 g/dL (ref 12.0–15.0)
Immature Granulocytes: 0 %
Lymphocytes Relative: 30 %
Lymphs Abs: 2.5 10*3/uL (ref 0.7–4.0)
MCH: 30.5 pg (ref 26.0–34.0)
MCHC: 33.4 g/dL (ref 30.0–36.0)
MCV: 91.3 fL (ref 80.0–100.0)
Monocytes Absolute: 0.5 10*3/uL (ref 0.1–1.0)
Monocytes Relative: 6 %
Neutro Abs: 5.2 10*3/uL (ref 1.7–7.7)
Neutrophils Relative %: 61 %
Platelet Count: 273 10*3/uL (ref 150–400)
RBC: 4.59 MIL/uL (ref 3.87–5.11)
RDW: 12.2 % (ref 11.5–15.5)
WBC Count: 8.4 10*3/uL (ref 4.0–10.5)
nRBC: 0 % (ref 0.0–0.2)

## 2023-04-27 LAB — IRON AND IRON BINDING CAPACITY (CC-WL,HP ONLY)
Iron: 82 ug/dL (ref 28–170)
Saturation Ratios: 29 % (ref 10.4–31.8)
TIBC: 286 ug/dL (ref 250–450)
UIBC: 204 ug/dL (ref 148–442)

## 2023-04-27 LAB — VITAMIN B12: Vitamin B-12: 456 pg/mL (ref 180–914)

## 2023-04-27 MED ORDER — CYANOCOBALAMIN 1000 MCG/ML IJ SOLN
1000.0000 ug | Freq: Once | INTRAMUSCULAR | Status: AC
  Administered 2023-04-27: 1000 ug via INTRAMUSCULAR
  Filled 2023-04-27: qty 1

## 2023-04-27 NOTE — Patient Instructions (Signed)
Vitamin B12 Injection What is this medication? Vitamin B12 (VAHY tuh min B12) prevents and treats low vitamin B12 levels in your body. It is used in people who do not get enough vitamin B12 from their diet or when their digestive tract does not absorb enough. Vitamin B12 plays an important role in maintaining the health of your nervous system and red blood cells. This medicine may be used for other purposes; ask your health care provider or pharmacist if you have questions. COMMON BRAND NAME(S): B-12 Compliance Kit, B-12 Injection Kit, Cyomin, Dodex, LA-12, Nutri-Twelve, Physicians EZ Use B-12, Primabalt, Vitamin Deficiency Injectable System - B12 What should I tell my care team before I take this medication? They need to know if you have any of these conditions: Kidney disease Leber's disease Megaloblastic anemia An unusual or allergic reaction to cyanocobalamin, cobalt, other medications, foods, dyes, or preservatives Pregnant or trying to get pregnant Breast-feeding How should I use this medication? This medication is injected into a muscle or deeply under the skin. It is usually given in a clinic or care team's office. However, your care team may teach you how to inject yourself. Follow all instructions. Talk to your care team about the use of this medication in children. Special care may be needed. Overdosage: If you think you have taken too much of this medicine contact a poison control center or emergency room at once. NOTE: This medicine is only for you. Do not share this medicine with others. What if I miss a dose? If you are given your dose at a clinic or care team's office, call to reschedule your appointment. If you give your own injections, and you miss a dose, take it as soon as you can. If it is almost time for your next dose, take only that dose. Do not take double or extra doses. What may interact with this medication? Alcohol Colchicine This list may not describe all possible  interactions. Give your health care provider a list of all the medicines, herbs, non-prescription drugs, or dietary supplements you use. Also tell them if you smoke, drink alcohol, or use illegal drugs. Some items may interact with your medicine. What should I watch for while using this medication? Visit your care team regularly. You may need blood work done while you are taking this medication. You may need to follow a special diet. Talk to your care team. Limit your alcohol intake and avoid smoking to get the best benefit. What side effects may I notice from receiving this medication? Side effects that you should report to your care team as soon as possible: Allergic reactions--skin rash, itching, hives, swelling of the face, lips, tongue, or throat Swelling of the ankles, hands, or feet Trouble breathing Side effects that usually do not require medical attention (report to your care team if they continue or are bothersome): Diarrhea This list may not describe all possible side effects. Call your doctor for medical advice about side effects. You may report side effects to FDA at 1-800-FDA-1088. Where should I keep my medication? Keep out of the reach of children. Store at room temperature between 15 and 30 degrees C (59 and 85 degrees F). Protect from light. Throw away any unused medication after the expiration date. NOTE: This sheet is a summary. It may not cover all possible information. If you have questions about this medicine, talk to your doctor, pharmacist, or health care provider.  2024 Elsevier/Gold Standard (2020-11-10 00:00:00)

## 2023-04-27 NOTE — Progress Notes (Signed)
 Hematology and Oncology Follow Up Visit  April Moon [id number] 1980-11-13 43 y.o. 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   Recent Colonoscopy was negative and EGD only showed localized mildly erythematous mucosa without bleeding in the gastric antrum. She is taking Prilosec BID.  Menstrual cycles are irregular- she is followed by OB-GYN for this No other blood loss noted. No abnormal bruising, no petechiae.  No fever, chills, n/v, cough, rash, dizziness, SOB, chest pain, palpitations or changes in bowel or bladder habits.  No swelling in her extremities.  No falls or syncope.  Appetite and hydration are a bit lower  Wt Readings from Last 3 Encounters:  04/27/23 127 lb (57.6 kg)  02/13/23 129 lb 6.4 oz (58.7 kg)  01/24/23 128 lb 1.3 oz (58.1 kg)   ECOG Performance Status: 1 - Symptomatic but completely ambulatory  Medications:  Allergies as of 04/27/2023       Reactions   Chicken Allergy Diarrhea, Nausea And Vomiting   Egg-derived Products Diarrhea, Nausea And Vomiting   Gluten Meal Diarrhea, Nausea And Vomiting   Sulfa Antibiotics Hives, Nausea And Vomiting, Other (See Comments)   Dizziness        Medication List        Accurate as of April 27, 2023  9:38 AM. If you have any questions, ask your nurse or doctor.          STOP taking these medications    benzonatate 100 MG capsule Commonly known as: TESSALON Stopped by: Rushie Chestnut   oseltamivir 75 MG capsule Commonly known as: TAMIFLU Stopped by: Rushie Chestnut   predniSONE 20 MG tablet Commonly known as: DELTASONE Stopped by:  Rushie Chestnut       TAKE these medications    albuterol 108 (90 Base) MCG/ACT inhaler Commonly known as: VENTOLIN HFA Inhale 1-2 puffs into the lungs every 6 (six) hours as needed for wheezing or shortness of breath.   amphetamine-dextroamphetamine 10 MG 24 hr capsule Commonly known as: ADDERALL XR Take 10 mg by mouth daily.   amphetamine-dextroamphetamine 5 MG tablet Commonly known as: ADDERALL Take 5 mg by mouth daily.   Auvelity 45-105 MG Tbcr Generic drug: Dextromethorphan-buPROPion ER Take 1 tablet by mouth daily.   buPROPion 150 MG 24 hr tablet Commonly known as: WELLBUTRIN XL Take 150 mg by mouth daily.   dicyclomine 10 MG capsule Commonly known as: BENTYL Take 1 capsule (10 mg total) by mouth 3 (three) times daily as needed.   FLUoxetine 20 MG capsule Commonly known as: PROZAC Take 40 mg by mouth daily.   LORazepam 0.5 MG tablet Commonly known as: ATIVAN Take 0.5 mg by mouth as directed.   omeprazole 40 MG capsule Commonly known as: PRILOSEC Take 1 capsule (40 mg total) by mouth daily. Take for 10 weeks   rizatriptan 10 MG tablet Commonly known as: MAXALT Take 1 tablet by mouth as needed.   topiramate 25 MG tablet Commonly known as: TOPAMAX Take 25 mg by mouth as directed.   topiramate 50 MG tablet Commonly known as: TOPAMAX Take 50  mg by mouth 2 (two) times daily.   traZODone 50 MG tablet Commonly known as: DESYREL Take 50 mg by mouth at bedtime as needed for sleep.        Allergies:  Allergies  Allergen Reactions   Chicken Allergy Diarrhea and Nausea And Vomiting   Egg-Derived Products Diarrhea and Nausea And Vomiting   Gluten Meal Diarrhea and Nausea And Vomiting   Sulfa Antibiotics Hives, Nausea And Vomiting and Other (See Comments)    Dizziness    Past Medical History, Surgical history, Social history, and Family History were reviewed and updated.  Review of Systems: All other 10 point review of systems is negative.    Physical Exam:  height is 5\' 5"  (1.651 m) and weight is 127 lb (57.6 kg). Her oral temperature is 98.1 F (36.7 C). Her blood pressure is 99/55 (abnormal) and her pulse is 69. Her respiration is 18 and oxygen saturation is 100%.   Wt Readings from Last 3 Encounters:  04/27/23 127 lb (57.6 kg)  02/13/23 129 lb 6.4 oz (58.7 kg)  01/24/23 128 lb 1.3 oz (58.1 kg)    Ocular: Sclerae unicteric, pupils equal, round and reactive to light Ear-nose-throat: Oropharynx clear, dentition fair Lymphatic: No cervical or supraclavicular adenopathy Lungs no rales or rhonchi, good excursion bilaterally Heart regular rate and rhythm, no murmur appreciated Abd soft, nontender, positive bowel sounds MSK no focal spinal tenderness, no joint edema Neuro: non-focal, well-oriented, appropriate affect   Lab Results  Component Value Date   WBC 8.4 04/27/2023   HGB 14.0 04/27/2023   HCT 41.9 04/27/2023   MCV 91.3 04/27/2023   PLT 273 04/27/2023   Lab Results  Component Value Date   FERRITIN 42 02/13/2023   IRON 140 02/13/2023   TIBC 270 02/13/2023   UIBC 130 (L) 02/13/2023   IRONPCTSAT 52 (H) 02/13/2023   Lab Results  Component Value Date   RETICCTPCT 1.4 02/13/2023   RBC 4.59 04/27/2023   No results found for: "KPAFRELGTCHN", "LAMBDASER", "KAPLAMBRATIO" No results found for: "IGGSERUM", "IGA", "IGMSERUM" No results found for: "TOTALPROTELP", "ALBUMINELP", "A1GS", "A2GS", "BETS", "BETA2SER", "GAMS", "MSPIKE", "SPEI"   Chemistry      Component Value Date/Time   NA 141 02/13/2023 0906   K 3.8 02/13/2023 0906   CL 106 02/13/2023 0906   CO2 26 02/13/2023 0906   BUN 10 02/13/2023 0906   CREATININE 0.85 02/13/2023 0906      Component Value Date/Time   CALCIUM 9.2 02/13/2023 0906   ALKPHOS 50 02/13/2023 0906   AST 13 (L) 02/13/2023 0906   ALT 14 02/13/2023 0906   BILITOT 0.8 02/13/2023 0906     No diagnosis found.  Impression and Plan: April Moon is a very pleasant 43 yo caucasian  female with iron deficiency anemia secondary to cycle and malabsorption with celiac disease. She also has significant B12 deficiency.   B12 today Adding B12 on to lab B12 monthly RTC 3 months APP, labs (CBC w/, CMP, iron, ferritin, retic, B12), injection-[place]  Rushie Chestnut, PA-C 2/13/20259:38 AM

## 2023-05-08 DIAGNOSIS — F900 Attention-deficit hyperactivity disorder, predominantly inattentive type: Secondary | ICD-10-CM | POA: Diagnosis not present

## 2023-05-08 DIAGNOSIS — F419 Anxiety disorder, unspecified: Secondary | ICD-10-CM | POA: Diagnosis not present

## 2023-05-08 DIAGNOSIS — F331 Major depressive disorder, recurrent, moderate: Secondary | ICD-10-CM | POA: Diagnosis not present

## 2023-06-05 DIAGNOSIS — F419 Anxiety disorder, unspecified: Secondary | ICD-10-CM | POA: Diagnosis not present

## 2023-06-05 DIAGNOSIS — F900 Attention-deficit hyperactivity disorder, predominantly inattentive type: Secondary | ICD-10-CM | POA: Diagnosis not present

## 2023-06-05 DIAGNOSIS — F331 Major depressive disorder, recurrent, moderate: Secondary | ICD-10-CM | POA: Diagnosis not present

## 2023-06-08 ENCOUNTER — Encounter: Admitting: Family

## 2023-06-16 ENCOUNTER — Ambulatory Visit (HOSPITAL_BASED_OUTPATIENT_CLINIC_OR_DEPARTMENT_OTHER)
Admission: RE | Admit: 2023-06-16 | Discharge: 2023-06-16 | Disposition: A | Discharge status: Home / Self Care | Source: Ambulatory Visit | Attending: Family | Admitting: Family

## 2023-06-16 ENCOUNTER — Ambulatory Visit (INDEPENDENT_AMBULATORY_CARE_PROVIDER_SITE_OTHER): Admitting: Family

## 2023-06-16 ENCOUNTER — Encounter: Payer: Self-pay | Admitting: Family

## 2023-06-16 VITALS — BP 104/70 | HR 91 | Ht 65.0 in | Wt 127.4 lb

## 2023-06-16 DIAGNOSIS — M542 Cervicalgia: Secondary | ICD-10-CM | POA: Diagnosis not present

## 2023-06-16 DIAGNOSIS — Z79899 Other long term (current) drug therapy: Secondary | ICD-10-CM

## 2023-06-16 DIAGNOSIS — D509 Iron deficiency anemia, unspecified: Secondary | ICD-10-CM

## 2023-06-16 DIAGNOSIS — R5383 Other fatigue: Secondary | ICD-10-CM

## 2023-06-16 DIAGNOSIS — M47812 Spondylosis without myelopathy or radiculopathy, cervical region: Secondary | ICD-10-CM | POA: Diagnosis not present

## 2023-06-16 NOTE — Progress Notes (Signed)
 April Moon is a 43 y.o. female with the following history as recorded in EpicCare:  Patient Active Problem List   Diagnosis Date Noted   IDA (iron deficiency anemia) 04/22/2022   Advanced maternal age in multigravida, third trimester 05/18/2017   ADD (attention deficit disorder)    Hirsutism    Migraine    Anxiety    Celiac disease 01/01/2016   Depression 01/01/2016   Environmental allergies 01/01/2016    Current Outpatient Medications  Medication Sig Dispense Refill   albuterol (VENTOLIN HFA) 108 (90 Base) MCG/ACT inhaler Inhale 1-2 puffs into the lungs every 6 (six) hours as needed for wheezing or shortness of breath. 8.5 g 1   amphetamine-dextroamphetamine (ADDERALL XR) 10 MG 24 hr capsule Take 10 mg by mouth daily.     amphetamine-dextroamphetamine (ADDERALL) 5 MG tablet Take 5 mg by mouth daily.     AUVELITY 45-105 MG TBCR Take 1 tablet by mouth daily.     buPROPion (WELLBUTRIN XL) 150 MG 24 hr tablet Take 150 mg by mouth daily.     dicyclomine (BENTYL) 10 MG capsule Take 1 capsule (10 mg total) by mouth 3 (three) times daily as needed. 90 capsule 0   FLUoxetine (PROZAC) 20 MG capsule Take 40 mg by mouth daily.     lithium 300 MG tablet Take 300 mg by mouth once.     LORazepam (ATIVAN) 0.5 MG tablet Take 0.5 mg by mouth as directed.     rizatriptan (MAXALT) 10 MG tablet Take 1 tablet by mouth as needed.     topiramate (TOPAMAX) 25 MG tablet Take 25 mg by mouth as directed.     topiramate (TOPAMAX) 50 MG tablet Take 50 mg by mouth 2 (two) times daily.     traZODone (DESYREL) 50 MG tablet Take 50 mg by mouth at bedtime as needed for sleep.     omeprazole (PRILOSEC) 40 MG capsule Take 1 capsule (40 mg total) by mouth daily. Take for 10 weeks 30 capsule 2   No current facility-administered medications for this visit.    Allergies: Chicken allergy, Egg-derived products, Gluten meal, and Sulfa antibiotics  Past Medical History:  Diagnosis Date   ADD (attention deficit  disorder)    Allergy    Anemia    Anxiety    Carpal tunnel syndrome, right    Celiac disease    Chicken pox    Depression    prozac   GERD (gastroesophageal reflux disease)    Hirsutism    Migraine     Past Surgical History:  Procedure Laterality Date   COLONOSCOPY     LASIK  2006   TONSILLECTOMY     UPPER GASTROINTESTINAL ENDOSCOPY     VAGINAL DELIVERY     x2    Family History  Problem Relation Age of Onset   Autoimmune disease Mother        Langerhan's cell histiocytosis   Barrett's esophagus Father    Breast cancer Maternal Aunt    Colon cancer Maternal Aunt    Esophageal cancer Maternal Grandmother    Breast cancer Maternal Grandmother    Breast cancer Paternal Grandmother    Stomach cancer Paternal Grandmother    Ovarian cancer Paternal Grandmother    Rectal cancer Neg Hx     Social History   Tobacco Use   Smoking status: Never   Smokeless tobacco: Never  Substance Use Topics   Alcohol use: Yes    Comment: occ    Subjective:  Presents for yearly "check in visit"; has recently started Lithium and her psychiatrist is asking to have labs updated; Patient continuing to work with multiple specialists including GI, GYN and hematology;  She would also like to discuss neck pain which has now been present for 9 months; feels that symptoms are worse recently;     Objective:  Vitals:   06/16/23 1329  BP: 104/70  Pulse: 91  SpO2: 97%  Weight: 127 lb 6.4 oz (57.8 kg)  Height: 5\' 5"  (1.651 m)    General: Well developed, well nourished, in no acute distress  Skin : Warm and dry.  Head: Normocephalic and atraumatic  Eyes: Sclera and conjunctiva clear; pupils round and reactive to light; extraocular movements intact  Ears: External normal; canals clear; tympanic membranes normal  Oropharynx: Pink, supple. No suspicious lesions  Neck: Supple without thyromegaly, adenopathy  Lungs: Respirations unlabored; clear to auscultation bilaterally without wheeze,  rales, rhonchi  CVS exam: normal rate and regular rhythm.  Neurologic: Alert and oriented; speech intact; face symmetrical; moves all extremities well; CNII-XII intact without focal deficit   Assessment:  1. Other fatigue   2. Lithium level checked every six months   3. Cervical pain (neck)   4. Iron deficiency anemia, unspecified iron deficiency anemia type     Plan:  Will update labs today- patient will plan to share with her psychiatrist; Update cervical X-ray today- patient is aware that could be extra cost associated with imaging downstairs and agrees to proceed; may need to consider MRI and/ or referral to neck specialist;  Time spent with patient 30 minutes  No follow-ups on file.  Orders Placed This Encounter  Procedures   DG Cervical Spine Complete    Standing Status:   Future    Number of Occurrences:   1    Expiration Date:   06/15/2024    Reason for Exam (SYMPTOM  OR DIAGNOSIS REQUIRED):   neck pain    Is patient pregnant?:   No    Preferred imaging location?:   [facility]   CBC with Differential/Platelet   Comp Met (CMET)   TSH   Lithium level   Iron, TIBC and Ferritin Panel    Requested Prescriptions    No prescriptions requested or ordered in this encounter

## 2023-06-16 NOTE — Patient Instructions (Signed)
 Ask your GYN about uterine ablation or IUD;

## 2023-06-17 LAB — COMPREHENSIVE METABOLIC PANEL WITH GFR
AG Ratio: 1.9 (calc) (ref 1.0–2.5)
ALT: 14 U/L (ref 6–29)
AST: 15 U/L (ref 10–30)
Albumin: 4.1 g/dL (ref 3.6–5.1)
Alkaline phosphatase (APISO): 55 U/L (ref 31–125)
BUN: 12 mg/dL (ref 7–25)
CO2: 26 mmol/L (ref 20–32)
Calcium: 9.1 mg/dL (ref 8.6–10.2)
Chloride: 109 mmol/L (ref 98–110)
Creat: 0.69 mg/dL (ref 0.50–0.99)
Globulin: 2.2 g/dL (ref 1.9–3.7)
Glucose, Bld: 101 mg/dL — ABNORMAL HIGH (ref 65–99)
Potassium: 3.6 mmol/L (ref 3.5–5.3)
Sodium: 139 mmol/L (ref 135–146)
Total Bilirubin: 0.6 mg/dL (ref 0.2–1.2)
Total Protein: 6.3 g/dL (ref 6.1–8.1)
eGFR: 112 mL/min/{1.73_m2}

## 2023-06-17 LAB — CBC WITH DIFFERENTIAL/PLATELET
Absolute Lymphocytes: 1408 {cells}/uL (ref 850–3900)
Absolute Monocytes: 221 {cells}/uL (ref 200–950)
Basophils Absolute: 69 {cells}/uL (ref 0–200)
Basophils Relative: 1 %
Eosinophils Absolute: 117 {cells}/uL (ref 15–500)
Eosinophils Relative: 1.7 %
HCT: 43.7 % (ref 35.0–45.0)
Hemoglobin: 14.1 g/dL (ref 11.7–15.5)
MCH: 30.3 pg (ref 27.0–33.0)
MCHC: 32.3 g/dL (ref 32.0–36.0)
MCV: 93.8 fL (ref 80.0–100.0)
MPV: 10 fL (ref 7.5–12.5)
Monocytes Relative: 3.2 %
Neutro Abs: 5085 {cells}/uL (ref 1500–7800)
Neutrophils Relative %: 73.7 %
Platelets: 272 10*3/uL (ref 140–400)
RBC: 4.66 10*6/uL (ref 3.80–5.10)
RDW: 12.3 % (ref 11.0–15.0)
Total Lymphocyte: 20.4 %
WBC: 6.9 10*3/uL (ref 3.8–10.8)

## 2023-06-17 LAB — IRON,TIBC AND FERRITIN PANEL
%SAT: 36 % (ref 16–45)
Ferritin: 41 ng/mL (ref 16–232)
Iron: 95 ug/dL (ref 40–190)
TIBC: 262 ug/dL (ref 250–450)

## 2023-06-17 LAB — TSH: TSH: 0.62 m[IU]/L

## 2023-06-17 LAB — LITHIUM LEVEL: Lithium Lvl: <0.3 mmol/L — ABNORMAL LOW (ref 0.6–1.2)

## 2023-06-19 ENCOUNTER — Encounter: Payer: Self-pay | Admitting: Family

## 2023-06-19 ENCOUNTER — Other Ambulatory Visit: Payer: Self-pay | Admitting: Family

## 2023-06-19 DIAGNOSIS — G8929 Other chronic pain: Secondary | ICD-10-CM

## 2023-06-22 ENCOUNTER — Other Ambulatory Visit: Payer: Self-pay | Admitting: Family

## 2023-06-22 DIAGNOSIS — G8929 Other chronic pain: Secondary | ICD-10-CM

## 2023-07-05 ENCOUNTER — Telehealth (HOSPITAL_BASED_OUTPATIENT_CLINIC_OR_DEPARTMENT_OTHER): Payer: Self-pay

## 2023-08-02 DIAGNOSIS — F419 Anxiety disorder, unspecified: Secondary | ICD-10-CM | POA: Diagnosis not present

## 2023-08-02 DIAGNOSIS — F331 Major depressive disorder, recurrent, moderate: Secondary | ICD-10-CM | POA: Diagnosis not present

## 2023-08-02 DIAGNOSIS — F900 Attention-deficit hyperactivity disorder, predominantly inattentive type: Secondary | ICD-10-CM | POA: Diagnosis not present

## 2023-10-02 DIAGNOSIS — Z1231 Encounter for screening mammogram for malignant neoplasm of breast: Secondary | ICD-10-CM | POA: Diagnosis not present

## 2023-10-02 DIAGNOSIS — Z01419 Encounter for gynecological examination (general) (routine) without abnormal findings: Secondary | ICD-10-CM | POA: Diagnosis not present

## 2023-10-16 DIAGNOSIS — F419 Anxiety disorder, unspecified: Secondary | ICD-10-CM | POA: Diagnosis not present

## 2023-10-16 DIAGNOSIS — F331 Major depressive disorder, recurrent, moderate: Secondary | ICD-10-CM | POA: Diagnosis not present

## 2023-10-16 DIAGNOSIS — F900 Attention-deficit hyperactivity disorder, predominantly inattentive type: Secondary | ICD-10-CM | POA: Diagnosis not present

## 2023-10-30 DIAGNOSIS — F331 Major depressive disorder, recurrent, moderate: Secondary | ICD-10-CM | POA: Diagnosis not present

## 2023-11-02 DIAGNOSIS — F331 Major depressive disorder, recurrent, moderate: Secondary | ICD-10-CM | POA: Diagnosis not present

## 2023-11-08 DIAGNOSIS — F331 Major depressive disorder, recurrent, moderate: Secondary | ICD-10-CM | POA: Diagnosis not present

## 2023-11-09 DIAGNOSIS — F331 Major depressive disorder, recurrent, moderate: Secondary | ICD-10-CM | POA: Diagnosis not present

## 2023-11-10 DIAGNOSIS — F331 Major depressive disorder, recurrent, moderate: Secondary | ICD-10-CM | POA: Diagnosis not present

## 2023-11-14 DIAGNOSIS — F331 Major depressive disorder, recurrent, moderate: Secondary | ICD-10-CM | POA: Diagnosis not present

## 2023-11-20 ENCOUNTER — Telehealth: Admitting: Physician Assistant

## 2023-11-20 DIAGNOSIS — R3989 Other symptoms and signs involving the genitourinary system: Secondary | ICD-10-CM

## 2023-11-20 MED ORDER — AMOXICILLIN-POT CLAVULANATE 875-125 MG PO TABS: 1.0000 | ORAL_TABLET | Freq: Two times a day (BID) | ORAL | 0 refills | Status: DC

## 2023-11-20 NOTE — Patient Instructions (Signed)
 April Moon, thank you for joining April CHRISTELLA Dickinson, PA-C for today's virtual visit.  While this provider is not your primary care provider (PCP), if your PCP is located in our provider database this encounter information will be shared with them immediately following your visit.   A [facility] MyChart account gives you access to today's visit and all your visits, tests, and labs performed at [facility]  click here if you don't have a [facility] MyChart account or go to [email or web address]  Consent: (Patient) April Moon provided verbal consent for this virtual visit at the beginning of the encounter.  Current Medications:  Current Outpatient Medications:    amoxicillin -clavulanate (AUGMENTIN ) 875-125 MG tablet, Take 1 tablet by mouth 2 (two) times daily., Disp: 14 tablet, Rfl: 0   albuterol  (VENTOLIN  HFA) 108 (90 Base) MCG/ACT inhaler, Inhale 1-2 puffs into the lungs every 6 (six) hours as needed for wheezing or shortness of breath., Disp: 8.5 g, Rfl: 1   amphetamine-dextroamphetamine (ADDERALL XR) 10 MG 24 hr capsule, Take 10 mg by mouth daily., Disp: , Rfl:    amphetamine-dextroamphetamine (ADDERALL) 5 MG tablet, Take 5 mg by mouth daily., Disp: , Rfl:    AUVELITY 45-105 MG TBCR, Take 1 tablet by mouth daily., Disp: , Rfl:    buPROPion (WELLBUTRIN XL) 150 MG 24 hr tablet, Take 150 mg by mouth daily., Disp: , Rfl:    dicyclomine  (BENTYL ) 10 MG capsule, Take 1 capsule (10 mg total) by mouth 3 (three) times daily as needed., Disp: 90 capsule, Rfl: 0   FLUoxetine  (PROZAC ) 20 MG capsule, Take 40 mg by mouth daily., Disp: , Rfl:    lithium  300 MG tablet, Take 300 mg by mouth once., Disp: , Rfl:    LORazepam (ATIVAN) 0.5 MG tablet, Take 0.5 mg by mouth as directed., Disp: , Rfl:    omeprazole  (PRILOSEC) 40 MG capsule, Take 1 capsule (40 mg total) by mouth daily. Take for 10 weeks, Disp: 30 capsule, Rfl: 2   rizatriptan (MAXALT) 10 MG tablet, Take 1 tablet by mouth  as needed., Disp: , Rfl:    topiramate (TOPAMAX) 25 MG tablet, Take 25 mg by mouth as directed., Disp: , Rfl:    topiramate (TOPAMAX) 50 MG tablet, Take 50 mg by mouth 2 (two) times daily., Disp: , Rfl:    traZODone (DESYREL) 50 MG tablet, Take 50 mg by mouth at bedtime as needed for sleep., Disp: , Rfl:    Medications ordered in this encounter:  Meds ordered this encounter  Medications   amoxicillin -clavulanate (AUGMENTIN ) 875-125 MG tablet    Sig: Take 1 tablet by mouth 2 (two) times daily.    Dispense:  14 tablet    Refill:  0    Supervising Provider:   BLAISE ALEENE KIDD [[id number]]     *If you need refills on other medications prior to your next appointment, please contact your pharmacy*  Follow-Up: Call back or seek an in-person evaluation if the symptoms worsen or if the condition fails to improve as anticipated.  [facility] [phone]  Other Instructions Urinary Tract Infection, Female A urinary tract infection (UTI) is an infection in your urinary tract. The urinary tract is made up of organs that make, store, and get rid of pee (urine) in your body. These organs include: The kidneys. The ureters. The bladder. The urethra. What are the causes? Most UTIs are caused by germs called bacteria. They may be in or near your genitals. These germs grow  and cause swelling in your urinary tract. What increases the risk? You're more likely to get a UTI if: You're a female. The urethra is shorter in females than in males. You have a soft tube called a catheter that drains your pee. You can't control when you pee or poop. You have trouble peeing because of: A kidney stone. A urinary blockage. A nerve condition that affects your bladder. Not getting enough to drink. You're sexually active. You use a birth control inside your vagina, like spermicide. You're pregnant. You have low levels of the hormone estrogen in your body. You're an older adult. You're also more  likely to get a UTI if you have other health problems. These may include: Diabetes. A weak immune system. Your immune system is your body's defense system. Sickle cell disease. Injury of the spine. What are the signs or symptoms? Symptoms may include: Needing to pee right away. Peeing small amounts often. Pain or burning when you pee. Blood in your pee. Pee that smells bad or odd. Pain in your belly or lower back. You may also: Feel confused. This may be the first symptom in older adults. Vomit. Not feel hungry. Feel tired or easily annoyed. Have a fever or chills. How is this diagnosed? A UTI is diagnosed based on your medical history and an exam. You may also have other tests. These may include: Pee tests. Blood tests. Tests for sexually transmitted infections (STIs). If you've had more than one UTI, you may need to have imaging studies done to find out why you keep getting them. How is this treated? A UTI can be treated by: Taking antibiotics or other medicines. Drinking enough fluid to keep your pee pale yellow. In rare cases, a UTI can cause a very bad condition called sepsis. Sepsis may be treated in the hospital. Follow these instructions at home: Medicines Take your medicines only as told by your health care provider. If you were given antibiotics, take them as told by your provider. Do not stop taking them even if you start to feel better. General instructions Make sure you: Pee often and fully. Do not hold your pee for a long time. Wipe from front to back after you pee or poop. Use each tissue only once when you wipe. Pee after you have sex. Do not douche or use sprays or powders in your genital area. Contact a health care provider if: Your symptoms don't get better after 1-2 days of taking antibiotics. Your symptoms go away and then come back. You have a fever or chills. You vomit or feel like you may vomit. Get help right away if: You have very bad pain in  your back or lower belly. You faint. This information is not intended to replace advice given to you by your health care provider. Make sure you discuss any questions you have with your health care provider. Document Revised: 02/08/2023 Document Reviewed: 06/03/2022 Elsevier Patient Education  [employer].   If you have been instructed to have an in-person evaluation today at a local Urgent Care facility, please use the link below. It will take you to a list of all of our available [facility], including address, phone number and hours of operation. Please do not delay care.  [facility]  If you or a family member do not have a primary care provider, use the link below to schedule a visit and establish care. When you choose a [facility] primary care physician  or advanced practice provider, you gain a long-term partner in health. Find a Primary Care Provider  Learn more about [facility] in-office and virtual care options: [facility] - Get Care Now

## 2023-11-20 NOTE — Progress Notes (Signed)
 Virtual Visit Consent   April Moon, you are scheduled for a virtual visit with a [facility] provider today. Just as with appointments in the office, your consent must be obtained to participate. Your consent will be active for this visit and any virtual visit you may have with one of our providers in the next 365 days. If you have a MyChart account, a copy of this consent can be sent to you electronically.  As this is a virtual visit, video technology does not allow for your provider to perform a traditional examination. This may limit your provider's ability to fully assess your condition. If your provider identifies any concerns that need to be evaluated in person or the need to arrange testing (such as labs, EKG, etc.), we will make arrangements to do so. Although advances in technology are sophisticated, we cannot ensure that it will always work on either your end or our end. If the connection with a video visit is poor, the visit may have to be switched to a telephone visit. With either a video or telephone visit, we are not always able to ensure that we have a secure connection.  By engaging in this virtual visit, you consent to the provision of healthcare and authorize for your insurance to be billed (if applicable) for the services provided during this visit. Depending on your insurance coverage, you may receive a charge related to this service.  I need to obtain your verbal consent now. Are you willing to proceed with your visit today? Kiaja Shorty has provided verbal consent on 11/20/2023 for a virtual visit (video or telephone). Delon CHRISTELLA Dickinson, PA-C  Date: 11/20/2023 9:54 AM   Virtual Visit via Video Note   I, Delon CHRISTELLA Dickinson, connected with  April Moon  ([id number], Oct 09, 1980) on 11/20/23 at  9:45 AM EDT by a video-enabled telemedicine application and verified that I am speaking with the correct person using two identifiers.  Location: Patient: Virtual Visit Location  Patient: Home Provider: Virtual Visit Location Provider: Home Office   I discussed the limitations of evaluation and management by telemedicine and the availability of in person appointments. The patient expressed understanding and agreed to proceed.    History of Present Illness: April Moon is a 43 y.o. who identifies as a female who was assigned female at birth, and is being seen today for dysuria.  HPI: Urinary Tract Infection  This is a new problem. The current episode started yesterday. The problem occurs every urination. The problem has been unchanged. The quality of the pain is described as burning. The pain is moderate. There has been no fever. Associated symptoms include frequency and urgency. Pertinent negatives include no chills, hematuria, hesitancy, nausea or sweats. Associated symptoms comments: Cloudy, suprapubic pressure. She has tried increased fluids (cranberry juice) for the symptoms. The treatment provided no relief. Her past medical history is significant for recurrent UTIs.     Problems:  Patient Active Problem List   Diagnosis Date Noted   IDA (iron  deficiency anemia) 04/22/2022   Advanced maternal age in multigravida, third trimester 05/18/2017   ADD (attention deficit disorder)    Hirsutism    Migraine    Anxiety    Celiac disease 01/01/2016   Depression 01/01/2016   Environmental allergies 01/01/2016    Allergies:  Allergies  Allergen Reactions   Chicken Allergy Diarrhea and Nausea And Vomiting   Egg-Derived Products Diarrhea and Nausea And Vomiting   Gluten Meal Diarrhea and Nausea And Vomiting   Sulfa  Antibiotics Hives, Nausea And Vomiting and Other (See Comments)    Dizziness   Medications:  Current Outpatient Medications:    amoxicillin -clavulanate (AUGMENTIN ) 875-125 MG tablet, Take 1 tablet by mouth 2 (two) times daily., Disp: 14 tablet, Rfl: 0   albuterol  (VENTOLIN  HFA) 108 (90 Base) MCG/ACT inhaler, Inhale 1-2 puffs into the lungs every 6  (six) hours as needed for wheezing or shortness of breath., Disp: 8.5 g, Rfl: 1   amphetamine-dextroamphetamine (ADDERALL XR) 10 MG 24 hr capsule, Take 10 mg by mouth daily., Disp: , Rfl:    amphetamine-dextroamphetamine (ADDERALL) 5 MG tablet, Take 5 mg by mouth daily., Disp: , Rfl:    AUVELITY 45-105 MG TBCR, Take 1 tablet by mouth daily., Disp: , Rfl:    buPROPion (WELLBUTRIN XL) 150 MG 24 hr tablet, Take 150 mg by mouth daily., Disp: , Rfl:    dicyclomine  (BENTYL ) 10 MG capsule, Take 1 capsule (10 mg total) by mouth 3 (three) times daily as needed., Disp: 90 capsule, Rfl: 0   FLUoxetine  (PROZAC ) 20 MG capsule, Take 40 mg by mouth daily., Disp: , Rfl:    lithium  300 MG tablet, Take 300 mg by mouth once., Disp: , Rfl:    LORazepam (ATIVAN) 0.5 MG tablet, Take 0.5 mg by mouth as directed., Disp: , Rfl:    omeprazole  (PRILOSEC) 40 MG capsule, Take 1 capsule (40 mg total) by mouth daily. Take for 10 weeks, Disp: 30 capsule, Rfl: 2   rizatriptan (MAXALT) 10 MG tablet, Take 1 tablet by mouth as needed., Disp: , Rfl:    topiramate (TOPAMAX) 25 MG tablet, Take 25 mg by mouth as directed., Disp: , Rfl:    topiramate (TOPAMAX) 50 MG tablet, Take 50 mg by mouth 2 (two) times daily., Disp: , Rfl:    traZODone (DESYREL) 50 MG tablet, Take 50 mg by mouth at bedtime as needed for sleep., Disp: , Rfl:   Observations/Objective: Patient is well-developed, well-nourished in no acute distress.  Resting comfortably at home.  Head is normocephalic, atraumatic.  No labored breathing.  Speech is clear and coherent with logical content.  Patient is alert and oriented at baseline.    Assessment and Plan: 1. Suspected UTI (Primary) - amoxicillin -clavulanate (AUGMENTIN ) 875-125 MG tablet; Take 1 tablet by mouth 2 (two) times daily.  Dispense: 14 tablet; Refill: 0  - Worsening symptoms.  - Will treat empirically with Augmentin  - May use AZO for bladder spasms - Continue to push fluids.  - Seek in person  evaluation for urine culture if symptoms do not improve or if they worsen.   Follow Up Instructions: I discussed the assessment and treatment plan with the patient. The patient was provided an opportunity to ask questions and all were answered. The patient agreed with the plan and demonstrated an understanding of the instructions.  A copy of instructions were sent to the patient via MyChart unless otherwise noted below.    The patient was advised to call back or seek an in-person evaluation if the symptoms worsen or if the condition fails to improve as anticipated.    Delon CHRISTELLA Dickinson, PA-C

## 2023-11-21 DIAGNOSIS — F900 Attention-deficit hyperactivity disorder, predominantly inattentive type: Secondary | ICD-10-CM | POA: Diagnosis not present

## 2023-11-21 DIAGNOSIS — F419 Anxiety disorder, unspecified: Secondary | ICD-10-CM | POA: Diagnosis not present

## 2023-11-21 DIAGNOSIS — F331 Major depressive disorder, recurrent, moderate: Secondary | ICD-10-CM | POA: Diagnosis not present

## 2023-11-24 DIAGNOSIS — F331 Major depressive disorder, recurrent, moderate: Secondary | ICD-10-CM | POA: Diagnosis not present

## 2023-11-27 DIAGNOSIS — F331 Major depressive disorder, recurrent, moderate: Secondary | ICD-10-CM | POA: Diagnosis not present

## 2023-11-28 DIAGNOSIS — F331 Major depressive disorder, recurrent, moderate: Secondary | ICD-10-CM | POA: Diagnosis not present

## 2023-11-30 DIAGNOSIS — F331 Major depressive disorder, recurrent, moderate: Secondary | ICD-10-CM | POA: Diagnosis not present

## 2023-12-12 DIAGNOSIS — F331 Major depressive disorder, recurrent, moderate: Secondary | ICD-10-CM | POA: Diagnosis not present

## 2024-01-08 DIAGNOSIS — F332 Major depressive disorder, recurrent severe without psychotic features: Secondary | ICD-10-CM | POA: Diagnosis not present

## 2024-01-08 DIAGNOSIS — F419 Anxiety disorder, unspecified: Secondary | ICD-10-CM | POA: Diagnosis not present

## 2024-01-08 DIAGNOSIS — F902 Attention-deficit hyperactivity disorder, combined type: Secondary | ICD-10-CM | POA: Diagnosis not present

## 2024-01-12 ENCOUNTER — Telehealth: Payer: Self-pay

## 2024-01-12 NOTE — Telephone Encounter (Signed)
 Mychart message sent to pt.

## 2024-01-12 NOTE — Telephone Encounter (Signed)
 Manami Tutor, CMA   01/11/2024  1:42 PM  CRM # [id number] Owner: None Status: Unresolved Open  Priority: Routine Created on: 01/11/2024 01:33 PM By: Neil Viola BROCKS    Primary Information   Source  Karstens, Delon Bali (Patient)   Subject  Luce, Delon Bali (Patient)   Topic  Clinical - Lab/Test Results    Communication  Reason for CRM: Patient had lab work done with psychiatrist and was told to follow up with pcp - I scheduled her a TOC appt with Melissa next available 04/27/23. Patient will be uploading lab results to MyChart portal and would like to discuss with a nurse once uploaded. Please advise.       Patient Information   Patient Name Gender DOB SSN  Laaibah, Wartman Female Dec 12, 1980 [id number]    Contacts   Contact Date/Time Type Contact Phone/Fax  01/11/2024 01:21 PM EDT Phone (Incoming) Kant, Delon Bali (Self)

## 2024-01-12 NOTE — Telephone Encounter (Signed)
 April Moon   01/12/2024  9:38 AM  Call ID [id number]. Patient called in to schedule [facility] appointment since PCP has left. Also stated she had blood work done by another physician that she sees and lab work was back. She said she would get them sent over because she was told to follow up with PCP. Not sure what is meant by CMA can't result labs? Labs were already resulted, patient stated she was told to follow up with PCP immediately regarding the lab work and wanted to speak with someone before her TOC appointment. Acute visits are scheduled when a patient presents new symptoms within 14 days, we are not aware that an acute appointment is needed to discuss results for an established patient, resolving as no error. Specialist however, saw error/comment and reached out to patient to schedule acute appointment, was not able to reach her. Thank you for your submission.     Fye, Shalena C   01/11/2024  4:15 PM  Called patient left voicemail to call back office and schedule acute visit with any provider     Lacole Komorowski, CMA   01/11/2024  1:43 PM  Needs acute visit w/ a provider in the office to discuss any blood work results. Leita Elbe no longer in the office, and CMA can not result labs.

## 2024-01-19 ENCOUNTER — Ambulatory Visit (INDEPENDENT_AMBULATORY_CARE_PROVIDER_SITE_OTHER): Admitting: Medical

## 2024-01-19 ENCOUNTER — Encounter: Payer: Self-pay | Admitting: Medical

## 2024-01-19 VITALS — BP 106/70 | HR 84 | Ht 65.0 in | Wt 124.4 lb

## 2024-01-19 DIAGNOSIS — F411 Generalized anxiety disorder: Secondary | ICD-10-CM | POA: Diagnosis not present

## 2024-01-19 DIAGNOSIS — D649 Anemia, unspecified: Secondary | ICD-10-CM | POA: Diagnosis not present

## 2024-01-19 DIAGNOSIS — R7989 Other specified abnormal findings of blood chemistry: Secondary | ICD-10-CM

## 2024-01-19 DIAGNOSIS — F419 Anxiety disorder, unspecified: Secondary | ICD-10-CM

## 2024-01-19 DIAGNOSIS — R634 Abnormal weight loss: Secondary | ICD-10-CM | POA: Diagnosis not present

## 2024-01-19 DIAGNOSIS — F909 Attention-deficit hyperactivity disorder, unspecified type: Secondary | ICD-10-CM | POA: Diagnosis not present

## 2024-01-19 DIAGNOSIS — F32A Depression, unspecified: Secondary | ICD-10-CM

## 2024-01-19 NOTE — Patient Instructions (Signed)
 Possible hyperthyroid. Low TSH and normal T4 suggest subclinical hyperthyroidism. But varied recent symptoms include jitteriness, weight loss, and heat intolerance. Differential includes rare lithium -induced hyperthyroidism which I understand is very rare(hypo much more common). Further evaluation required. - Ordered repeat TSH, T4, and thyroid  peroxidase antibody tests. - Scheduled follow-up for lab tests on Tuesday at 9:00 AM. - Refer to endocrinologist if TSH remains low or other markers change - Monitor heart rate; consider beta blocker if significant increase/tachycardia ocurrs  Iron  deficiency anemia Previously treated with iron  infusions. Last CBC in April 2025. No recent infusions needed. - Ordered CBC and iron  panel to assess current status.  Abnormal weight loss Significant weight loss of 40 pounds over two years. Potential link to suspected hyperthyroidism. Further evaluation needed. - Monitor weight and symptoms with thyroid  evaluation.  General Health Maintenance Discussed medication regimen and potential side effects. Current medications include Avelity, Vyvanse, and lithium . Discussed potential side effects of Avelity. - Continue current medication regimen as prescribed by psychiatrist. - Monitor for new or worsening symptoms related to medication use.  Follow up date to be determined after lab review.

## 2024-01-19 NOTE — Progress Notes (Signed)
 Subjective:    Patient ID: April Moon, female    DOB: 02-10-1981, 43 y.o.   MRN: [id number]  HPI April Moon is a 43 year old female with celiac disease and anemia who presents with symptoms suggestive of hyperthyroidism.  Over the past two years, she has experienced significant weight loss of 40 pounds, persistent jitteriness, and a sensation of 'vibrating' in her heart and lungs. She feels fatigued and experiences cognitive fog. Her menstrual cycles have become irregular, despite previously being regular. She has been diagnosed with anemia, for which she has received iron  infusions.  Additionally, she reports significant hair loss and new hair growth on her face, shoulders, and arms.  Recent lab results from October 14th indicated a TSH level of 0.024, which is lower than her previous level of 0.950 a year ago. Her T4 level was 10.2, up from 8.4 the previous year, and her T3 uptake was 31. These results were flagged by her psychiatrist, who ordered the labs due to her lithium  use.  She has been on lithium  for approximately six months. She also takes Ativan, a benzodiazepine, almost daily at a quarter of a milligram to manage her symptoms of jitteriness and a racing heart. Her medication regimen for mental health includes Avelity and Vyvanse, which she switched to from Adderall three to four months ago. Her psychiatrist has adjusted her Vyvanse dosage due to her symptoms.  She has celiac disease although she adheres to a gluten-free diet and her colonoscopy results indicate healthy cells. She has undergone extensive testing, including upper and lower GI evaluations and a pelvic ultrasound, to rule out other causes of her anemia and symptoms.   Review of Systems  Constitutional:  Negative for chills and fever.  HENT:  Negative for ear pain.   Respiratory:  Negative for cough, chest tightness and wheezing.   Cardiovascular:  Positive for palpitations. Negative for chest pain.        No current palpitation though does describe possible palpitatoins in the past.  Gastrointestinal:  Negative for abdominal pain.  Musculoskeletal:  Negative for back pain.  Skin:  Negative for rash.  Neurological:  Negative for dizziness, syncope and facial asymmetry.       Shakes/trembles intermittently during the exam.  Psychiatric/Behavioral:  Positive for dysphoric mood. Negative for behavioral problems and suicidal ideas. The patient is nervous/anxious.        Pt managed by psychiatrist.    Past Medical History:  Diagnosis Date   ADD (attention deficit disorder)    Allergy    Anemia    Anxiety    Carpal tunnel syndrome, right    Celiac disease    Chicken pox    Depression    prozac    GERD (gastroesophageal reflux disease)    Hirsutism    Migraine      Social History   Socioeconomic History   Marital status: Married    Spouse name: Not on file   Number of children: 2   Years of education: Not on file   Highest education level: Not on file  Occupational History   Occupation: Self employed  Tobacco Use   Smoking status: Never   Smokeless tobacco: Never  Vaping Use   Vaping status: Never Used  Substance and Sexual Activity   Alcohol use: Yes    Comment: occ   Drug use: No   Sexual activity: Yes  Other Topics Concern   Not on file  Social History Narrative   Divorced  Works a Corporate Treasurer arm of [employer]   1 daughter born 2015   Grew up in [place] .   Recently moved from [place] .    Social Drivers of Corporate Investment Banker Strain: Not on file  Food Insecurity: No Food Insecurity (04/22/2022)   Hunger Vital Sign    Worried About Running Out of Food in the Last Year: Never true    Ran Out of Food in the Last Year: Never true  Transportation Needs: No Transportation Needs (04/22/2022)   PRAPARE - Administrator, Civil Service (Medical): No    Lack of Transportation (Non-Medical): No  Physical  Activity: Not on file  Stress: Not on file  Social Connections: Not on file  Intimate Partner Violence: Not At Risk (04/22/2022)   Humiliation, Afraid, Rape, and Kick questionnaire    Fear of Current or Ex-Partner: No    Emotionally Abused: No    Physically Abused: No    Sexually Abused: No    Past Surgical History:  Procedure Laterality Date   COLONOSCOPY     LASIK  2006   TONSILLECTOMY     UPPER GASTROINTESTINAL ENDOSCOPY     VAGINAL DELIVERY     x2    Family History  Problem Relation Age of Onset   Autoimmune disease Mother        Langerhan's cell histiocytosis   Barrett's esophagus Father    Breast cancer Maternal Aunt    Colon cancer Maternal Aunt    Esophageal cancer Maternal Grandmother    Breast cancer Maternal Grandmother    Breast cancer Paternal Grandmother    Stomach cancer Paternal Grandmother    Ovarian cancer Paternal Grandmother    Rectal cancer Neg Hx     Allergies  Allergen Reactions   Chicken Allergy Diarrhea and Nausea And Vomiting   Egg Protein-Containing Drug Products Diarrhea and Nausea And Vomiting   Gluten Meal Diarrhea and Nausea And Vomiting   Sulfa Antibiotics Hives, Nausea And Vomiting and Other (See Comments)    Dizziness    Current Outpatient Medications on File Prior to Visit  Medication Sig Dispense Refill   albuterol  (VENTOLIN  HFA) 108 (90 Base) MCG/ACT inhaler Inhale 1-2 puffs into the lungs every 6 (six) hours as needed for wheezing or shortness of breath. 8.5 g 1   amoxicillin -clavulanate (AUGMENTIN ) 875-125 MG tablet Take 1 tablet by mouth 2 (two) times daily. 14 tablet 0   amphetamine-dextroamphetamine (ADDERALL XR) 10 MG 24 hr capsule Take 10 mg by mouth daily.     amphetamine-dextroamphetamine (ADDERALL) 5 MG tablet Take 5 mg by mouth daily.     AUVELITY 45-105 MG TBCR Take 1 tablet by mouth daily.     buPROPion (WELLBUTRIN XL) 150 MG 24 hr tablet Take 150 mg by mouth daily.     dicyclomine  (BENTYL ) 10 MG capsule Take 1  capsule (10 mg total) by mouth 3 (three) times daily as needed. 90 capsule 0   FLUoxetine  (PROZAC ) 20 MG capsule Take 40 mg by mouth daily.     lithium  300 MG tablet Take 300 mg by mouth once.     LORazepam (ATIVAN) 0.5 MG tablet Take 0.5 mg by mouth as directed.     omeprazole  (PRILOSEC) 40 MG capsule Take 1 capsule (40 mg total) by mouth daily. Take for 10 weeks 30 capsule 2   rizatriptan (MAXALT) 10 MG tablet Take 1 tablet by mouth as needed.     topiramate (TOPAMAX) 25  MG tablet Take 25 mg by mouth as directed.     topiramate (TOPAMAX) 50 MG tablet Take 50 mg by mouth 2 (two) times daily.     traZODone (DESYREL) 50 MG tablet Take 50 mg by mouth at bedtime as needed for sleep.     No current facility-administered medications on file prior to visit.    BP 106/70 (BP Location: Left Arm, Patient Position: Sitting, Cuff Size: Normal)   Pulse 84   Ht 5' 5 (1.651 m)   Wt 124 lb 6.4 oz (56.4 kg)   SpO2 99%   BMI 20.70 kg/m        Objective:   Physical Exam  General Mental Status- Alert. General Appearance- Not in acute distress.   Skin General: Color- Normal Color. Moisture- Normal Moisture.  Neck No JVD. No thyromegaly  Chest and Lung Exam Auscultation: Breath Sounds:-Normal.  Cardiovascular Auscultation:Rythm- Regular. Murmurs & Other Heart Sounds:Auscultation of the heart reveals- No Murmurs.  Abdomen Inspection:-Inspeection Normal. Palpation/Percussion:Note:No mass. Palpation and Percussion of the abdomen reveal- Non Tender, Non Distended + BS, no rebound or guarding.   Neurologic Cranial Nerve exam:- CN III-XII intact(No nystagmus), symmetric smile. Strength:- 5/5 equal and symmetric strength both upper and lower extremities.         Assessment & Plan:   Possible hyperthyroid. Low TSH and normal T4 suggest subclinical hyperthyroidism. But varied recent symptoms include jitteriness, weight loss, and heat intolerance. Differential includes rare  lithium -induced hyperthyroidism which I understand is very rare(hypo much more common). Further evaluation required. - Ordered repeat TSH, T4, and thyroid  peroxidase antibody tests. - Scheduled follow-up for lab tests on Tuesday at 9:00 AM. - Refer to endocrinologist if TSH remains low or other markers change - Monitor heart rate; consider beta blocker if significant increase/tachycardia ocurrs  Iron  deficiency anemia Previously treated with iron  infusions. Last CBC in April 2025. No recent infusions needed. - Ordered CBC and iron  panel to assess current status.  Abnormal weight loss Significant weight loss of 40 pounds over two years. Potential link to suspected hyperthyroidism. Further evaluation needed. - Monitor weight and symptoms with thyroid  evaluation.  General Health Maintenance Discussed medication regimen and potential side effects. Current medications include Avelity, Vyvanse, and lithium . Discussed potential side effects of Avelity. - Continue current medication regimen as prescribed by psychiatrist. - Monitor for new or worsening symptoms related to medication use.  Follow up date to be determined after lab review.   I personally spent a total of 50 minutes in the care of the patient today including performing a medically appropriate exam/evaluation, counseling and educating, placing orders, and documenting clinical information in the EHR.

## 2024-01-22 DIAGNOSIS — F332 Major depressive disorder, recurrent severe without psychotic features: Secondary | ICD-10-CM | POA: Diagnosis not present

## 2024-01-22 DIAGNOSIS — F909 Attention-deficit hyperactivity disorder, unspecified type: Secondary | ICD-10-CM | POA: Diagnosis not present

## 2024-01-22 DIAGNOSIS — F411 Generalized anxiety disorder: Secondary | ICD-10-CM | POA: Diagnosis not present

## 2024-01-23 ENCOUNTER — Other Ambulatory Visit (INDEPENDENT_AMBULATORY_CARE_PROVIDER_SITE_OTHER)

## 2024-01-23 DIAGNOSIS — R946 Abnormal results of thyroid function studies: Secondary | ICD-10-CM | POA: Diagnosis not present

## 2024-01-23 DIAGNOSIS — D649 Anemia, unspecified: Secondary | ICD-10-CM

## 2024-01-23 DIAGNOSIS — R7989 Other specified abnormal findings of blood chemistry: Secondary | ICD-10-CM | POA: Diagnosis not present

## 2024-01-23 LAB — CBC WITH DIFFERENTIAL/PLATELET
Basophils Absolute: 0.1 K/uL (ref 0.0–0.1)
Basophils Relative: 1 % (ref 0.0–3.0)
Eosinophils Absolute: 0.3 K/uL (ref 0.0–0.7)
Eosinophils Relative: 3.7 % (ref 0.0–5.0)
HCT: 39.4 % (ref 36.0–46.0)
Hemoglobin: 13.2 g/dL (ref 12.0–15.0)
Lymphocytes Relative: 23.1 % (ref 12.0–46.0)
Lymphs Abs: 1.6 K/uL (ref 0.7–4.0)
MCHC: 33.5 g/dL (ref 30.0–36.0)
MCV: 87.3 fl (ref 78.0–100.0)
Monocytes Absolute: 0.3 K/uL (ref 0.1–1.0)
Monocytes Relative: 4.8 % (ref 3.0–12.0)
Neutro Abs: 4.7 K/uL (ref 1.4–7.7)
Neutrophils Relative %: 67.4 % (ref 43.0–77.0)
Platelets: 309 K/uL (ref 150.0–400.0)
RBC: 4.51 Mil/uL (ref 3.87–5.11)
RDW: 12.8 % (ref 11.5–15.5)
WBC: 6.9 K/uL (ref 4.0–10.5)

## 2024-01-23 LAB — TSH: TSH: 0.33 u[IU]/mL — ABNORMAL LOW (ref 0.35–5.50)

## 2024-01-23 LAB — T4, FREE: Free T4: 0.79 ng/dL (ref 0.60–1.60)

## 2024-01-24 ENCOUNTER — Ambulatory Visit: Payer: Self-pay | Admitting: Medical

## 2024-01-24 DIAGNOSIS — F332 Major depressive disorder, recurrent severe without psychotic features: Secondary | ICD-10-CM | POA: Diagnosis not present

## 2024-01-24 DIAGNOSIS — F411 Generalized anxiety disorder: Secondary | ICD-10-CM | POA: Diagnosis not present

## 2024-01-24 DIAGNOSIS — F909 Attention-deficit hyperactivity disorder, unspecified type: Secondary | ICD-10-CM | POA: Diagnosis not present

## 2024-01-25 DIAGNOSIS — F909 Attention-deficit hyperactivity disorder, unspecified type: Secondary | ICD-10-CM | POA: Diagnosis not present

## 2024-01-25 DIAGNOSIS — F411 Generalized anxiety disorder: Secondary | ICD-10-CM | POA: Diagnosis not present

## 2024-01-25 DIAGNOSIS — F332 Major depressive disorder, recurrent severe without psychotic features: Secondary | ICD-10-CM | POA: Diagnosis not present

## 2024-01-25 LAB — IRON,TIBC AND FERRITIN PANEL
%SAT: 12 % — ABNORMAL LOW (ref 16–45)
Ferritin: 8 ng/mL — ABNORMAL LOW (ref 16–232)
Iron: 40 ug/dL (ref 40–190)
TIBC: 326 ug/dL (ref 250–450)

## 2024-01-25 LAB — THYROID PEROXIDASE ANTIBODY: Thyroperoxidase Ab SerPl-aCnc: <1 [IU]/mL (ref <9)

## 2024-01-25 NOTE — Addendum Note (Signed)
 Addended by: DORINA DALLAS HERO on: 01/25/2024 05:38 PM   Modules accepted: Orders

## 2024-01-29 DIAGNOSIS — F411 Generalized anxiety disorder: Secondary | ICD-10-CM | POA: Diagnosis not present

## 2024-01-29 DIAGNOSIS — F332 Major depressive disorder, recurrent severe without psychotic features: Secondary | ICD-10-CM | POA: Diagnosis not present

## 2024-01-29 DIAGNOSIS — F909 Attention-deficit hyperactivity disorder, unspecified type: Secondary | ICD-10-CM | POA: Diagnosis not present

## 2024-01-30 ENCOUNTER — Telehealth: Payer: Self-pay

## 2024-01-30 NOTE — Telephone Encounter (Signed)
 Received phone call from patient stating that she recently had labs drawn by her PCP which she was told indicated she may need IV iron .  This RN reviewed patient chart and labs. This RN spoke with Lauraine Pepper, NP who stated that patient will need IV iron  based on her labs. Pt already has an appointment with Lauraine Pepper NP on 02/06/2024. Infusion appointment made following provider appointment. Per Lauraine Pepper, NP pt insurance approves Venofer  300 mg and orders are already placed.  Pt aware and scheduling aware to make infusion appointments.

## 2024-01-31 DIAGNOSIS — G43109 Migraine with aura, not intractable, without status migrainosus: Secondary | ICD-10-CM | POA: Diagnosis not present

## 2024-02-01 ENCOUNTER — Encounter: Payer: Self-pay | Admitting: Internal Medicine

## 2024-02-01 ENCOUNTER — Ambulatory Visit: Admitting: Internal Medicine

## 2024-02-01 NOTE — Progress Notes (Deleted)
 Name: April Moon  MRN/ DOB: [id number], 11-09-1980    Age/ Sex: 43 y.o., female    PCP: Jason Leita Repine, FNP (Inactive)   Reason for Endocrinology Evaluation: Low TSH      Date of Initial Endocrinology Evaluation: 02/01/2024     HPI: April Moon is a 43 y.o. female with a past medical history of migraine headaches, celiac disease. The patient presented for initial endocrinology clinic visit on 02/01/2024 for consultative assistance with her low TSH .   The patient has been noted with low TSH of 0.33u IU/ML with normal free T4 at 0.79 NG/DL in November, 7974 TPO antibodies undetectable  At the time of testing she reported symptoms of jittery sensation, weight loss, heat intolerance to her PCP.  Of note the patient is on lithium       HISTORY:  Past Medical History:  Past Medical History:  Diagnosis Date   ADD (attention deficit disorder)    Allergy    Anemia    Anxiety    Carpal tunnel syndrome, right    Celiac disease    Chicken pox    Depression    prozac    GERD (gastroesophageal reflux disease)    Hirsutism    Migraine    Past Surgical History:  Past Surgical History:  Procedure Laterality Date   COLONOSCOPY     LASIK  2006   TONSILLECTOMY     UPPER GASTROINTESTINAL ENDOSCOPY     VAGINAL DELIVERY     x2    Social History:  reports that she has never smoked. She has never used smokeless tobacco. She reports current alcohol use. She reports that she does not use drugs. Family History: family history includes Autoimmune disease in her mother; Barrett's esophagus in her father; Breast cancer in her maternal aunt, maternal grandmother, and paternal grandmother; Colon cancer in her maternal aunt; Esophageal cancer in her maternal grandmother; Ovarian cancer in her paternal grandmother; Stomach cancer in her paternal grandmother.   HOME MEDICATIONS: Allergies as of 02/01/2024       Reactions   Chicken Allergy Diarrhea, Nausea And Vomiting    Egg Protein-containing Drug Products Diarrhea, Nausea And Vomiting   Gluten Meal Diarrhea, Nausea And Vomiting   Sulfa Antibiotics Hives, Nausea And Vomiting, Other (See Comments)   Dizziness        Medication List        Accurate as of February 01, 2024  8:06 AM. If you have any questions, ask your nurse or doctor.          albuterol  108 (90 Base) MCG/ACT inhaler Commonly known as: VENTOLIN  HFA Inhale 1-2 puffs into the lungs every 6 (six) hours as needed for wheezing or shortness of breath.   amoxicillin -clavulanate 875-125 MG tablet Commonly known as: AUGMENTIN  Take 1 tablet by mouth 2 (two) times daily.   amphetamine-dextroamphetamine 10 MG 24 hr capsule Commonly known as: ADDERALL XR Take 10 mg by mouth daily.   amphetamine-dextroamphetamine 5 MG tablet Commonly known as: ADDERALL Take 5 mg by mouth daily.   Auvelity 45-105 MG Tbcr Generic drug: Dextromethorphan-buPROPion ER Take 1 tablet by mouth daily.   buPROPion 150 MG 24 hr tablet Commonly known as: WELLBUTRIN XL Take 150 mg by mouth daily.   dicyclomine  10 MG capsule Commonly known as: BENTYL  Take 1 capsule (10 mg total) by mouth 3 (three) times daily as needed.   FLUoxetine  20 MG capsule Commonly known as: PROZAC  Take 40 mg by mouth daily.  lithium  300 MG tablet Take 300 mg by mouth once.   LORazepam 0.5 MG tablet Commonly known as: ATIVAN Take 0.5 mg by mouth as directed.   omeprazole  40 MG capsule Commonly known as: PRILOSEC Take 1 capsule (40 mg total) by mouth daily. Take for 10 weeks   rizatriptan 10 MG tablet Commonly known as: MAXALT Take 1 tablet by mouth as needed.   topiramate 25 MG tablet Commonly known as: TOPAMAX Take 25 mg by mouth as directed.   topiramate 50 MG tablet Commonly known as: TOPAMAX Take 50 mg by mouth 2 (two) times daily.   traZODone 50 MG tablet Commonly known as: DESYREL Take 50 mg by mouth at bedtime as needed for sleep.          REVIEW  OF SYSTEMS: A comprehensive ROS was conducted with the patient and is negative except as per HPI and below:  ROS     OBJECTIVE:  VS: There were no vitals taken for this visit.   Wt Readings from Last 3 Encounters:  01/19/24 124 lb 6.4 oz (56.4 kg)  06/16/23 127 lb 6.4 oz (57.8 kg)  04/27/23 127 lb (57.6 kg)     EXAM: General: Pt appears well and is in NAD  Eyes: External eye exam normal without stare, lid lag or exophthalmos.  EOM intact.  PERRL.  Neck: General: Supple without adenopathy. Thyroid : Thyroid  size normal.  No goiter or nodules appreciated. No thyroid  bruit.  Lungs: Clear with good BS bilat   Heart: Auscultation: RRR.  Abdomen: Soft, nontender  Extremities:  BL LE: No pretibial edema   Mental Status: Judgment, insight: Intact Orientation: Oriented to time, place, and person Mood and affect: No depression, anxiety, or agitation     DATA REVIEWED: ***    ASSESSMENT/PLAN/RECOMMENDATIONS:   ***    Medications :  Signed electronically by: Stefano Redgie Butts, MD  [facility] Endocrinology  [facility] [address] [place] [place], [place] [address] Phone: [phone] FAX: [phone]   CC: Jason Leita Repine, FNP (Inactive) No address on file Phone: None Fax: None   Return to Endocrinology clinic as below: Future Appointments  Date Time Provider Department Center  02/01/2024 11:30 AM Cloris Flippo, Donell Redgie, MD [facility] None  02/06/2024 10:30 AM Franchot Lauraine HERO, NP [facility] None  04/26/2024  3:00 PM Daryl Setter, NP [facility] [address]

## 2024-02-02 ENCOUNTER — Other Ambulatory Visit

## 2024-02-02 ENCOUNTER — Ambulatory Visit (INDEPENDENT_AMBULATORY_CARE_PROVIDER_SITE_OTHER): Admitting: Internal Medicine

## 2024-02-02 ENCOUNTER — Encounter: Payer: Self-pay | Admitting: Internal Medicine

## 2024-02-02 VITALS — BP 104/68 | HR 94 | Ht 65.0 in | Wt 124.0 lb

## 2024-02-02 DIAGNOSIS — E0590 Thyrotoxicosis, unspecified without thyrotoxic crisis or storm: Secondary | ICD-10-CM | POA: Diagnosis not present

## 2024-02-02 DIAGNOSIS — L680 Hirsutism: Secondary | ICD-10-CM

## 2024-02-02 NOTE — Progress Notes (Unsigned)
 Name: April Moon  MRN/ DOB: [id number], 11-25-1980    Age/ Sex: 43 y.o., female    PCP: Jason Leita Repine, FNP (Inactive)   Reason for Endocrinology Evaluation: Low TSH      Date of Initial Endocrinology Evaluation: 02/02/2024     HPI: April Moon is a 43 y.o. female with a past medical history of migraine headaches, celiac disease, treatment resistant depression. The patient presented for initial endocrinology clinic visit on 02/02/2024 for consultative assistance with her low TSH .   The patient has been noted with low TSH of 0.33u IU/ML with normal free T4 at 0.79 NG/DL in November, 7974 TPO antibodies undetectable  At the time of testing she reported symptoms of jittery sensation, weight loss, heat intolerance to her PCP.  Of note the patient is on lithium  due to resistant depression   No local neck swelling  Has palpitations  Has tremors Has generalized anxiety and jittery sensation  No eye symptoms  No Biotin  The patient has noted hair loss approximately a year ago, she was not on lithium  at the time and her TFTs were normal, this has improved recently but continues to shed more hair than she believes is normal for her She has iron  deficiency anemia , follows with hematology, has received iron  infusions, and able to tolerate iron  tablets, eat mainly vegetarian/plant-based diet  She is also c/o hirsutism over the shoulder , upper arms, chin and neck  Has tried laser without help  Plucks multiple times daily  LMP a month ago, historically regular until 2 years ago when her menstruations became irregular , sometimes having 2 weeks in between cycles, follows with Gynecology , declined COC's due to headaches  She is on gluten free diet      No FH of thyroid  disease  HISTORY:  Past Medical History:  Past Medical History:  Diagnosis Date   ADD (attention deficit disorder)    Allergy    Anemia    Anxiety    Carpal tunnel syndrome, right    Celiac  disease    Chicken pox    Depression    prozac    GERD (gastroesophageal reflux disease)    Hirsutism    Migraine    Past Surgical History:  Past Surgical History:  Procedure Laterality Date   COLONOSCOPY     LASIK  2006   TONSILLECTOMY     UPPER GASTROINTESTINAL ENDOSCOPY     VAGINAL DELIVERY     x2    Social History:  reports that she has never smoked. She has never used smokeless tobacco. She reports current alcohol use. She reports that she does not use drugs. Family History: family history includes Autoimmune disease in her mother; Barrett's esophagus in her father; Breast cancer in her maternal aunt, maternal grandmother, and paternal grandmother; Colon cancer in her maternal aunt; Esophageal cancer in her maternal grandmother; Ovarian cancer in her paternal grandmother; Stomach cancer in her paternal grandmother.   HOME MEDICATIONS: Allergies as of 02/02/2024       Reactions   Chicken Allergy Diarrhea, Nausea And Vomiting   Egg Protein-containing Drug Products Diarrhea, Nausea And Vomiting   Gluten Meal Diarrhea, Nausea And Vomiting   Sulfa Antibiotics Hives, Nausea And Vomiting, Other (See Comments)   Dizziness        Medication List        Accurate as of February 02, 2024  1:21 PM. If you have any questions, ask your nurse or doctor.  STOP taking these medications    amphetamine-dextroamphetamine 10 MG 24 hr capsule Commonly known as: ADDERALL XR Stopped by: Donell PARAS Rogen Porte   amphetamine-dextroamphetamine 5 MG tablet Commonly known as: ADDERALL Stopped by: Alder Murri J Roth Ress   buPROPion 150 MG 24 hr tablet Commonly known as: WELLBUTRIN XL Stopped by: Froilan Mclean J Courtenay Hirth       TAKE these medications    albuterol  108 (90 Base) MCG/ACT inhaler Commonly known as: VENTOLIN  HFA Inhale 1-2 puffs into the lungs every 6 (six) hours as needed for wheezing or shortness of breath.   amoxicillin -clavulanate 875-125 MG tablet Commonly  known as: AUGMENTIN  Take 1 tablet by mouth 2 (two) times daily.   Auvelity 45-105 MG Tbcr Generic drug: Dextromethorphan-buPROPion ER Take 1 tablet by mouth daily.   dicyclomine  10 MG capsule Commonly known as: BENTYL  Take 1 capsule (10 mg total) by mouth 3 (three) times daily as needed.   FLUoxetine  20 MG capsule Commonly known as: PROZAC  Take 40 mg by mouth daily.   lithium  300 MG tablet Take 300 mg by mouth once.   LORazepam 0.5 MG tablet Commonly known as: ATIVAN Take 0.5 mg by mouth as directed.   omeprazole  40 MG capsule Commonly known as: PRILOSEC Take 1 capsule (40 mg total) by mouth daily. Take for 10 weeks   rizatriptan 10 MG tablet Commonly known as: MAXALT Take 1 tablet by mouth as needed.   topiramate 50 MG tablet Commonly known as: TOPAMAX Take 50 mg by mouth 2 (two) times daily. What changed: Another medication with the same name was removed. Continue taking this medication, and follow the directions you see here. Changed by: Rashaun Curl J Edder Bellanca   traZODone 50 MG tablet Commonly known as: DESYREL Take 50 mg by mouth at bedtime as needed for sleep.          REVIEW OF SYSTEMS: A comprehensive ROS was conducted with the patient and is negative except as per HPI    OBJECTIVE:  VS: BP 104/68   Pulse 94   Ht 5' 5 (1.651 m)   Wt 124 lb (56.2 kg)   SpO2 98%   BMI 20.63 kg/m    Wt Readings from Last 3 Encounters:  02/02/24 124 lb (56.2 kg)  01/19/24 124 lb 6.4 oz (56.4 kg)  06/16/23 127 lb 6.4 oz (57.8 kg)     EXAM: General: Pt appears well and is in NAD  Eyes: External eye exam normal with stare, lid lag or exophthalmos.  EOM intact.   Neck: General: Supple without adenopathy. Thyroid : Thyroid  size normal.  No goiter or nodules appreciated.  Lungs: Clear with good BS bilat   Heart: Auscultation: RRR.  Abdomen: Soft, nontender  Extremities:  BL LE: No pretibial edema   Mental Status: Judgment, insight: Intact Orientation: Oriented to  time, place, and person Mood and affect: No depression, anxiety, or agitation     DATA REVIEWED: ***    ASSESSMENT/PLAN/RECOMMENDATIONS:   Hyperthyroidism :   - Patient multiple symptoms - Discussed differential diagnosis to include lithium  induced, Graves' disease, autonomous thyroid  nodule -We discussed that Graves' Disease is a result of an autoimmune condition involving the thyroid .    - We discussed with pt the benefits of methimazole in the Tx of hyperthyroidism, as well as the possible side effects/complications of anti-thyroid  drug Tx (specifically detailing the rare, but serious side effect of agranulocytosis). She was informed of need for regular thyroid  function monitoring while on methimazole to ensure appropriate dosage without over-treatment. As  well, we discussed the possible side effects of methimazole including the chance of rash, the small chance of liver irritation/juandice and the <=1 in 300-400 chance of sudden onset agranulocytosis.  We discussed importance of going to ED promptly (and stopping methimazole) if shewere to develop significant fever with severe sore throat of other evidence of acute infection.    - Will proceed with TRAb and TSI check - TPO antibodies were undetectable in the past - Will proceed with thyroid  uptake and scan at the med center in [facility]     2. Perimenopause :  - Patient under the care of gynecology - Per patient, labs did not reveal postmenopausal status - I did explain to the patient this is perimenopause given that the symptoms started age 38, may last for 14 years - Unable to tolerate COC's due to headaches and also believe this will worsen her treatment resistant depression   3.  Hirsutism:   - Most likely physiological - Testosterone , DHEA-S pending   4. Iron  deficiency anemia:   - She is under the care of hematology - Multifactorial, GI malabsorption, decrease oral iron  intake, inability to tolerate oral iron ,  and heavy frequent menstruations   Follow-up in 3 months  Signed electronically by: Stefano Redgie Butts, MD  [facility] Endocrinology  [facility] [address] [place] [place], [place] [address] Phone: [phone] FAX: [phone]   CC: Jason Leita Repine, FNP (Inactive) No address on file Phone: None Fax: None   Return to Endocrinology clinic as below: Future Appointments  Date Time Provider Department Center  02/06/2024 10:30 AM Franchot Lauraine HERO, NP [facility] None  04/26/2024  3:00 PM Daryl Setter, NP [facility] [address]

## 2024-02-05 ENCOUNTER — Ambulatory Visit: Payer: Self-pay | Admitting: Internal Medicine

## 2024-02-06 ENCOUNTER — Inpatient Hospital Stay: Attending: Family | Admitting: Family

## 2024-02-06 ENCOUNTER — Inpatient Hospital Stay

## 2024-02-06 VITALS — BP 103/66 | HR 76 | Temp 98.5°F | Resp 16 | Wt 124.0 lb

## 2024-02-06 DIAGNOSIS — F332 Major depressive disorder, recurrent severe without psychotic features: Secondary | ICD-10-CM | POA: Diagnosis not present

## 2024-02-06 DIAGNOSIS — D509 Iron deficiency anemia, unspecified: Secondary | ICD-10-CM

## 2024-02-06 DIAGNOSIS — F909 Attention-deficit hyperactivity disorder, unspecified type: Secondary | ICD-10-CM | POA: Diagnosis not present

## 2024-02-06 DIAGNOSIS — E538 Deficiency of other specified B group vitamins: Secondary | ICD-10-CM | POA: Diagnosis not present

## 2024-02-06 DIAGNOSIS — F411 Generalized anxiety disorder: Secondary | ICD-10-CM | POA: Diagnosis not present

## 2024-02-06 DIAGNOSIS — R7989 Other specified abnormal findings of blood chemistry: Secondary | ICD-10-CM | POA: Diagnosis not present

## 2024-02-06 LAB — DHEA-SULFATE: DHEA-SO4: 191 ug/dL (ref 15–205)

## 2024-02-06 LAB — T3, FREE: T3, Free: 2.8 pg/mL (ref 2.3–4.2)

## 2024-02-06 LAB — TESTOSTERONE, TOTAL, LC/MS/MS: Testosterone, Total, LC-MS-MS: 21 ng/dL (ref 2–45)

## 2024-02-06 LAB — T4, FREE: Free T4: 1.1 ng/dL (ref 0.8–1.8)

## 2024-02-06 LAB — TRAB (TSH RECEPTOR BINDING ANTIBODY): TRAB: <1 IU/L (ref <=2.00)

## 2024-02-06 LAB — TSH: TSH: 0.69 m[IU]/L

## 2024-02-06 LAB — THYROID STIMULATING IMMUNOGLOBULIN: TSI: <89 %{baseline} (ref <140)

## 2024-02-06 MED ORDER — CYANOCOBALAMIN 1000 MCG/ML IJ SOLN
1000.0000 ug | Freq: Once | INTRAMUSCULAR | Status: AC
  Administered 2024-02-06: 1000 ug via INTRAMUSCULAR
  Filled 2024-02-06: qty 1

## 2024-02-06 MED ORDER — IRON SUCROSE 300 MG IVPB - SIMPLE MED
300.0000 mg | Freq: Once | Status: DC
  Filled 2024-02-06: qty 265

## 2024-02-06 MED ORDER — SODIUM CHLORIDE 0.9 % IV SOLN: INTRAVENOUS | Status: DC

## 2024-02-06 NOTE — Progress Notes (Signed)
 Venofer  not approved for today.  Prior Auth started per [place] with 48 hour turnaround.  Explained to patient.  Appt made for Monday pending authorization approval.

## 2024-02-06 NOTE — Progress Notes (Signed)
 Hematology and Oncology Follow Up Visit  April Moon [id number] [birth year] 43 y.o. 02/06/2024   Principle Diagnosis:  Iron  deficiency anemia secondary to cycle and malabsorption with celiac disease    Current Therapy:        IV iron  as indicated - Venofer  300 mg- last infusion was on 08/29/2022 B12 IM monthly   Interim History:  April Moon is here today for follow-up. She is symptomatic with fatigue, brain fog, dizziness, SOB with exertion, palpitations and headaches.  Iron  saturation last week was only 12% and ferritin 8. Hgb remained stable at 13.2, MCV 87, platelets 309 and WBC count 6.9.  Her cycle is regular with normal flow. No other blood loss noted.  No recent UC flare.  No abnormal bruising, no petechiae.  No fever, chills, n/v, cough, rash, chest pain, abdominal pain or changes in bowel or bladder habits.  No swelling, numbness or tingling in her extremities.  Appetite and hydration are good. Weight is stable at 124 lbs.    ECOG Performance Status: 1 - Symptomatic but completely ambulatory  Medications:  Allergies as of 02/06/2024       Reactions   Chicken Allergy Diarrhea, Nausea And Vomiting   Egg Protein-containing Drug Products Diarrhea, Nausea And Vomiting   Gluten Meal Diarrhea, Nausea And Vomiting   Sulfa Antibiotics Hives, Nausea And Vomiting, Other (See Comments)   Dizziness        Medication List        Accurate as of February 06, 2024 10:40 AM. If you have any questions, ask your nurse or doctor.          albuterol  108 (90 Base) MCG/ACT inhaler Commonly known as: VENTOLIN  HFA Inhale 1-2 puffs into the lungs every 6 (six) hours as needed for wheezing or shortness of breath.   amoxicillin -clavulanate 875-125 MG tablet Commonly known as: AUGMENTIN  Take 1 tablet by mouth 2 (two) times daily.   Auvelity 45-105 MG Tbcr Generic drug: Dextromethorphan-buPROPion ER Take 1 tablet by mouth daily.   dicyclomine  10 MG capsule Commonly known  as: BENTYL  Take 1 capsule (10 mg total) by mouth 3 (three) times daily as needed.   FLUoxetine  20 MG capsule Commonly known as: PROZAC  Take 40 mg by mouth daily.   lithium  300 MG tablet Take 300 mg by mouth once.   LORazepam 0.5 MG tablet Commonly known as: ATIVAN Take 0.5 mg by mouth as directed.   omeprazole  40 MG capsule Commonly known as: PRILOSEC Take 1 capsule (40 mg total) by mouth daily. Take for 10 weeks   rizatriptan 10 MG tablet Commonly known as: MAXALT Take 1 tablet by mouth as needed.   topiramate 50 MG tablet Commonly known as: TOPAMAX Take 50 mg by mouth 2 (two) times daily.   traZODone 50 MG tablet Commonly known as: DESYREL Take 50 mg by mouth at bedtime as needed for sleep.        Allergies:  Allergies  Allergen Reactions   Chicken Allergy Diarrhea and Nausea And Vomiting   Egg Protein-Containing Drug Products Diarrhea and Nausea And Vomiting   Gluten Meal Diarrhea and Nausea And Vomiting   Sulfa Antibiotics Hives, Nausea And Vomiting and Other (See Comments)    Dizziness    Past Medical History, Surgical history, Social history, and Family History were reviewed and updated.  Review of Systems: All other 10 point review of systems is negative.   Physical Exam:  vitals were not taken for this visit.   Wt Readings from Last  3 Encounters:  02/02/24 124 lb (56.2 kg)  01/19/24 124 lb 6.4 oz (56.4 kg)  06/16/23 127 lb 6.4 oz (57.8 kg)    Ocular: Sclerae unicteric, pupils equal, round and reactive to light Ear-nose-throat: Oropharynx clear, dentition fair Lymphatic: No cervical or supraclavicular adenopathy Lungs no rales or rhonchi, good excursion bilaterally Heart regular rate and rhythm, no murmur appreciated Abd soft, nontender, positive bowel sounds MSK no focal spinal tenderness, no joint edema Neuro: non-focal, well-oriented, appropriate affect Breasts: Deferred   Lab Results  Component Value Date   WBC 6.9 01/23/2024   HGB  13.2 01/23/2024   HCT 39.4 01/23/2024   MCV 87.3 01/23/2024   PLT 309.0 01/23/2024   Lab Results  Component Value Date   FERRITIN 8 (L) 01/23/2024   IRON  40 01/23/2024   TIBC 326 01/23/2024   UIBC 204 04/27/2023   IRONPCTSAT 12 (L) 01/23/2024   Lab Results  Component Value Date   RETICCTPCT 1.4 02/13/2023   RBC 4.51 01/23/2024   No results found for: KPAFRELGTCHN, LAMBDASER, KAPLAMBRATIO No results found for: IGGSERUM, IGA, IGMSERUM No results found for: STEPHANY CARLOTA BENSON MARKEL EARLA JOANNIE DOC VICK, SPEI   Chemistry      Component Value Date/Time   NA 139 06/16/2023 1402   K 3.6 06/16/2023 1402   CL 109 06/16/2023 1402   CO2 26 06/16/2023 1402   BUN 12 06/16/2023 1402   CREATININE 0.69 06/16/2023 1402      Component Value Date/Time   CALCIUM 9.1 06/16/2023 1402   ALKPHOS 57 04/27/2023 0908   AST 15 06/16/2023 1402   AST 18 04/27/2023 0908   ALT 14 06/16/2023 1402   ALT 26 04/27/2023 0908   BILITOT 0.6 06/16/2023 1402   BILITOT 0.6 04/27/2023 0908       Impression and Plan: April Moon is a very pleasant 43 yo caucasian female with iron  deficiency anemia and 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  We will get her set up for 3 doses of IV iron .  She will also get her B 12 today as planned and then monthly.  Follow-up in 3 months.   Lauraine Pepper, NP 11/25/202510:40 AM

## 2024-02-06 NOTE — Addendum Note (Signed)
 Addended by: SAM DONELL PARAS on: 02/06/2024 08:59 AM   Modules accepted: Orders

## 2024-02-06 NOTE — Patient Instructions (Addendum)
 Vitamin B12 Deficiency Vitamin B12 deficiency means that your body does not have enough vitamin B12. The body needs this important vitamin: To make red blood cells. To make genes (DNA). To help the nerves work. If you do not have enough vitamin B12 in your body, you can have health problems, such as not having enough red blood cells in the blood (anemia). What are the causes? Not eating enough foods that contain vitamin B12. Not being able to take in (absorb) vitamin B12 from the food that you eat. Certain diseases. A condition in which the body does not make enough of a certain protein. This results in your body not taking in enough vitamin B12. Having a surgery in which part of the stomach or small intestine is taken out. Taking medicines that make it hard for the body to take in vitamin B12. These include: Heartburn medicines. Some medicines that are used to treat diabetes. What increases the risk? Being an older adult. Eating a vegetarian or vegan diet that does not include any foods that come from animals. Not eating enough foods that contain vitamin B12 while you are pregnant. Taking certain medicines. Having alcoholism. What are the signs or symptoms? In some cases, there are no symptoms. If the condition leads to too few blood cells or nerve damage, symptoms can occur, such as: Feeling weak or tired. Not being hungry. Losing feeling (numbness) or tingling in your hands and feet. Redness and burning of the tongue. Feeling sad (depressed). Confusion or memory problems. Trouble walking. If anemia is very bad, symptoms can include: Being short of breath. Being dizzy. Having a very fast heartbeat. How is this treated? Changing the way you eat and drink, such as: Eating more foods that contain vitamin B12. Drinking little or no alcohol. Getting vitamin B12 shots. Taking vitamin B12 supplements by mouth (orally). Your doctor will tell you the dose that is best for you. Follow  these instructions at home: Eating and drinking  Eat foods that come from animals and have a lot of vitamin B12 in them. These include: Meats and poultry. This includes beef, pork, chicken, [place], and organ meats, such as liver. Seafood, such as clams, rainbow trout, salmon, tuna, and haddock. Eggs. Dairy foods such as milk, yogurt, and cheese. Eat breakfast cereals that have vitamin B12 added to them (are fortified). Check the label. The items listed above may not be a complete list of foods and beverages you can eat and drink. Contact a dietitian for more information. Alcohol use Do not drink alcohol if: Your doctor tells you not to drink. You are pregnant, may be pregnant, or are planning to become pregnant. If you drink alcohol: Limit how much you have to: 0-1 drink a day for women. 0-2 drinks a day for men. Know how much alcohol is in your drink. In the [place] one drink equals one 12 oz bottle of beer (355 mL), one 5 oz glass of wine (148 mL), or one 1 oz glass of hard liquor (44 mL). General instructions Get any vitamin B12 shots if told by your doctor. Take supplements only as told by your doctor. Follow the directions. Keep all follow-up visits. Contact a doctor if: Your symptoms come back. Your symptoms get worse or do not get better with treatment. Get help right away if: You have trouble breathing. You have a very fast heartbeat. You have chest pain. You get dizzy. You faint. These symptoms may be an emergency. Get help right away. Call 911.  Do not wait to see if the symptoms will go away. Do not drive yourself to the hospital. Summary Vitamin B12 deficiency means that your body is not getting enough of the vitamin. In some cases, there are no symptoms of this condition. Treatment may include making a change in the way you eat and drink, getting shots, or taking supplements. Eat foods that have vitamin B12 in them. This information is not intended to replace advice  given to you by your health care provider. Make sure you discuss any questions you have with your health care provider. Document Revised: 10/23/2020 Document Reviewed: 10/23/2020 Elsevier Patient Education  2024 [employer]Iron  Sucrose Injection What is this medication? IRON  SUCROSE (EYE ern SOO krose) treats low levels of iron  (iron  deficiency anemia) in people with kidney disease. Iron  is a mineral that plays an important role in making red blood cells, which carry oxygen from your lungs to the rest of your body. This medicine may be used for other purposes; ask your health care provider or pharmacist if you have questions. COMMON BRAND NAME(S): Venofer  What should I tell my care team before I take this medication? They need to know if you have any of these conditions: Anemia not caused by low iron  levels Heart disease High levels of iron  in the blood Kidney disease Liver disease An unusual or allergic reaction to iron , other medications, foods, dyes, or preservatives Pregnant or trying to get pregnant Breastfeeding How should I use this medication? This medication is infused into a vein. It is given by your care team in a hospital or clinic setting. Talk to your care team about the use of this medication in children. While it may be prescribed for children as young as 2 years for selected conditions, precautions do apply. Overdosage: If you think you have taken too much of this medicine contact a poison control center or emergency room at once. NOTE: This medicine is only for you. Do not share this medicine with others. What if I miss a dose? Keep appointments for follow-up doses. It is important not to miss your dose. Call your care team if you are unable to keep an appointment. What may interact with this medication? Do not take this medication with any of the following: Deferoxamine Dimercaprol Other iron  products This medication may also interact with the  following: Chloramphenicol Deferasirox This list may not describe all possible interactions. Give your health care provider a list of all the medicines, herbs, non-prescription drugs, or dietary supplements you use. Also tell them if you smoke, drink alcohol, or use illegal drugs. Some items may interact with your medicine. What should I watch for while using this medication? Visit your care team for regular checks on your progress. Tell your care team if your symptoms do not start to get better or if they get worse. You may need blood work done while you are taking this medication. You may need to eat more foods that contain iron . Talk to your care team. Foods that contain iron  include whole grains or cereals, dried fruits, beans, peas, leafy green vegetables, and organ meats (liver, kidney). What side effects may I notice from receiving this medication? Side effects that you should report to your care team as soon as possible: Allergic reactions--skin rash, itching, hives, swelling of the face, lips, tongue, or throat Low blood pressure--dizziness, feeling faint or lightheaded, blurry vision Shortness of breath Side effects that usually do not require medical attention (report to your care team if  they continue or are bothersome): Flushing Headache Joint pain Muscle pain Nausea Pain, redness, or irritation at injection site This list may not describe all possible side effects. Call your doctor for medical advice about side effects. You may report side effects to FDA at 1-800-FDA-1088. Where should I keep my medication? This medication is given in a hospital or clinic. It will not be stored at home. NOTE: This sheet is a summary. It may not cover all possible information. If you have questions about this medicine, talk to your doctor, pharmacist, or health care provider.  2024 Elsevier/Gold Standard (2022-10-19 00:00:00)

## 2024-02-06 NOTE — Progress Notes (Signed)
 Venofer  auth expired. Renewal request submitted today. Patient will be r/s for another infusion date.  Bridgett Leach [place], [place], BCPS, BCOP 02/06/2024 12:20 PM

## 2024-02-12 ENCOUNTER — Inpatient Hospital Stay: Attending: Hematology & Oncology

## 2024-02-12 DIAGNOSIS — F332 Major depressive disorder, recurrent severe without psychotic features: Secondary | ICD-10-CM | POA: Diagnosis not present

## 2024-02-12 DIAGNOSIS — D509 Iron deficiency anemia, unspecified: Secondary | ICD-10-CM

## 2024-02-12 DIAGNOSIS — D508 Other iron deficiency anemias: Secondary | ICD-10-CM | POA: Insufficient documentation

## 2024-02-12 DIAGNOSIS — F411 Generalized anxiety disorder: Secondary | ICD-10-CM | POA: Diagnosis not present

## 2024-02-12 DIAGNOSIS — K909 Intestinal malabsorption, unspecified: Secondary | ICD-10-CM | POA: Diagnosis present

## 2024-02-12 DIAGNOSIS — F909 Attention-deficit hyperactivity disorder, unspecified type: Secondary | ICD-10-CM | POA: Diagnosis not present

## 2024-02-12 MED ORDER — IRON SUCROSE 300 MG IVPB - SIMPLE MED
300.0000 mg | Freq: Once | Status: AC
  Administered 2024-02-12: 300 mg via INTRAVENOUS
  Filled 2024-02-12: qty 300

## 2024-02-12 MED ORDER — SODIUM CHLORIDE 0.9 % IV SOLN: INTRAVENOUS | Status: DC

## 2024-02-12 NOTE — Patient Instructions (Signed)

## 2024-02-15 DIAGNOSIS — F411 Generalized anxiety disorder: Secondary | ICD-10-CM | POA: Diagnosis not present

## 2024-02-15 DIAGNOSIS — F332 Major depressive disorder, recurrent severe without psychotic features: Secondary | ICD-10-CM | POA: Diagnosis not present

## 2024-02-15 DIAGNOSIS — F909 Attention-deficit hyperactivity disorder, unspecified type: Secondary | ICD-10-CM | POA: Diagnosis not present

## 2024-02-19 ENCOUNTER — Inpatient Hospital Stay

## 2024-02-19 DIAGNOSIS — F909 Attention-deficit hyperactivity disorder, unspecified type: Secondary | ICD-10-CM | POA: Diagnosis not present

## 2024-02-19 DIAGNOSIS — F332 Major depressive disorder, recurrent severe without psychotic features: Secondary | ICD-10-CM | POA: Diagnosis not present

## 2024-02-19 DIAGNOSIS — F411 Generalized anxiety disorder: Secondary | ICD-10-CM | POA: Diagnosis not present

## 2024-02-22 ENCOUNTER — Inpatient Hospital Stay

## 2024-02-22 VITALS — BP 107/65 | HR 65 | Temp 97.4°F | Resp 18

## 2024-02-22 DIAGNOSIS — D509 Iron deficiency anemia, unspecified: Secondary | ICD-10-CM

## 2024-02-22 DIAGNOSIS — D508 Other iron deficiency anemias: Secondary | ICD-10-CM | POA: Diagnosis not present

## 2024-02-22 MED ORDER — SODIUM CHLORIDE 0.9 % IV SOLN: INTRAVENOUS | Status: DC

## 2024-02-22 MED ORDER — IRON SUCROSE 300 MG IVPB - SIMPLE MED
300.0000 mg | Freq: Once | Status: AC
  Administered 2024-02-22: 300 mg via INTRAVENOUS
  Filled 2024-02-22: qty 300

## 2024-02-22 NOTE — Patient Instructions (Signed)

## 2024-02-26 ENCOUNTER — Inpatient Hospital Stay

## 2024-02-29 ENCOUNTER — Inpatient Hospital Stay

## 2024-02-29 VITALS — BP 101/68 | HR 60 | Temp 97.9°F | Resp 20

## 2024-02-29 DIAGNOSIS — F909 Attention-deficit hyperactivity disorder, unspecified type: Secondary | ICD-10-CM | POA: Diagnosis not present

## 2024-02-29 DIAGNOSIS — F332 Major depressive disorder, recurrent severe without psychotic features: Secondary | ICD-10-CM | POA: Diagnosis not present

## 2024-02-29 DIAGNOSIS — D508 Other iron deficiency anemias: Secondary | ICD-10-CM | POA: Diagnosis not present

## 2024-02-29 DIAGNOSIS — F411 Generalized anxiety disorder: Secondary | ICD-10-CM | POA: Diagnosis not present

## 2024-02-29 DIAGNOSIS — D509 Iron deficiency anemia, unspecified: Secondary | ICD-10-CM

## 2024-02-29 MED ORDER — IRON SUCROSE 300 MG IVPB - SIMPLE MED
300.0000 mg | Freq: Once | Status: AC
  Administered 2024-02-29: 13:00:00 300 mg via INTRAVENOUS
  Filled 2024-02-29: qty 300

## 2024-02-29 MED ORDER — SODIUM CHLORIDE 0.9 % IV SOLN: INTRAVENOUS | Status: DC

## 2024-02-29 MED ORDER — CYANOCOBALAMIN 1000 MCG/ML IJ SOLN
1000.0000 ug | Freq: Once | INTRAMUSCULAR | Status: AC
  Administered 2024-02-29: 15:00:00 1000 ug via INTRAMUSCULAR
  Filled 2024-02-29: qty 1

## 2024-02-29 NOTE — Patient Instructions (Signed)
 Iron Sucrose Injection What is this medication? IRON SUCROSE (EYE ern SOO krose) treats low levels of iron (iron deficiency anemia) in people with kidney disease. Iron is a mineral that plays an important role in making red blood cells, which carry oxygen from your lungs to the rest of your body. This medicine may be used for other purposes; ask your health care provider or pharmacist if you have questions. COMMON BRAND NAME(S): Venofer What should I tell my care team before I take this medication? They need to know if you have any of these conditions: Anemia not caused by low iron levels Heart disease High levels of iron in the blood Kidney disease Liver disease An unusual or allergic reaction to iron, other medications, foods, dyes, or preservatives Pregnant or trying to get pregnant Breastfeeding How should I use this medication? This medication is infused into a vein. It is given by your care team in a hospital or clinic setting. Talk to your care team about the use of this medication in children. While it may be prescribed for children as young as 2 years for selected conditions, precautions do apply. Overdosage: If you think you have taken too much of this medicine contact a poison control center or emergency room at once. NOTE: This medicine is only for you. Do not share this medicine with others. What if I miss a dose? Keep appointments for follow-up doses. It is important not to miss your dose. Call your care team if you are unable to keep an appointment. What may interact with this medication? Do not take this medication with any of the following: Deferoxamine Dimercaprol Other iron products This medication may also interact with the following: Chloramphenicol Deferasirox This list may not describe all possible interactions. Give your health care provider a list of all the medicines, herbs, non-prescription drugs, or dietary supplements you use. Also tell them if you smoke,  drink alcohol, or use illegal drugs. Some items may interact with your medicine. What should I watch for while using this medication? Visit your care team for regular checks on your progress. Tell your care team if your symptoms do not start to get better or if they get worse. You may need blood work done while you are taking this medication. You may need to eat more foods that contain iron. Talk to your care team. Foods that contain iron include whole grains or cereals, dried fruits, beans, peas, leafy green vegetables, and organ meats (liver, kidney). What side effects may I notice from receiving this medication? Side effects that you should report to your care team as soon as possible: Allergic reactions--skin rash, itching, hives, swelling of the face, lips, tongue, or throat Low blood pressure--dizziness, feeling faint or lightheaded, blurry vision Shortness of breath Side effects that usually do not require medical attention (report to your care team if they continue or are bothersome): Flushing Headache Joint pain Muscle pain Nausea Pain, redness, or irritation at injection site This list may not describe all possible side effects. Call your doctor for medical advice about side effects. You may report side effects to FDA at 1-800-FDA-1088. Where should I keep my medication? This medication is given in a hospital or clinic. It will not be stored at home. NOTE: This sheet is a summary. It may not cover all possible information. If you have questions about this medicine, talk to your doctor, pharmacist, or health care provider.  2024 Elsevier/Gold Standard (2022-10-19 00:00:00)Vitamin B12 Injection What is this medication? Vitamin B12 (  VAHY tuh min B12) prevents and treats low vitamin B12 levels in your body. It is used in people who do not get enough vitamin B12 from their diet or when their digestive tract does not absorb enough. Vitamin B12 plays an important role in maintaining the  health of your nervous system and red blood cells. This medicine may be used for other purposes; ask your health care provider or pharmacist if you have questions. COMMON BRAND NAME(S): B-12 Compliance Kit, B-12 Injection Kit, Cyomin, Dodex, LA-12, Nutri-Twelve, Physicians EZ Use B-12, Primabalt, Vitamin Deficiency Injectable System - B12 What should I tell my care team before I take this medication? They need to know if you have any of these conditions: Kidney disease Leber's disease Megaloblastic anemia An unusual or allergic reaction to cyanocobalamin, cobalt, other medications, foods, dyes, or preservatives Pregnant or trying to get pregnant Breast-feeding How should I use this medication? This medication is injected into a muscle or deeply under the skin. It is usually given in a clinic or care team's office. However, your care team may teach you how to inject yourself. Follow all instructions. Talk to your care team about the use of this medication in children. Special care may be needed. Overdosage: If you think you have taken too much of this medicine contact a poison control center or emergency room at once. NOTE: This medicine is only for you. Do not share this medicine with others. What if I miss a dose? If you are given your dose at a clinic or care team's office, call to reschedule your appointment. If you give your own injections, and you miss a dose, take it as soon as you can. If it is almost time for your next dose, take only that dose. Do not take double or extra doses. What may interact with this medication? Alcohol Colchicine This list may not describe all possible interactions. Give your health care provider a list of all the medicines, herbs, non-prescription drugs, or dietary supplements you use. Also tell them if you smoke, drink alcohol, or use illegal drugs. Some items may interact with your medicine. What should I watch for while using this medication? Visit your  care team regularly. You may need blood work done while you are taking this medication. You may need to follow a special diet. Talk to your care team. Limit your alcohol intake and avoid smoking to get the best benefit. What side effects may I notice from receiving this medication? Side effects that you should report to your care team as soon as possible: Allergic reactions--skin rash, itching, hives, swelling of the face, lips, tongue, or throat Swelling of the ankles, hands, or feet Trouble breathing Side effects that usually do not require medical attention (report to your care team if they continue or are bothersome): Diarrhea This list may not describe all possible side effects. Call your doctor for medical advice about side effects. You may report side effects to FDA at 1-800-FDA-1088. Where should I keep my medication? Keep out of the reach of children. Store at room temperature between 15 and 30 degrees C (59 and 85 degrees F). Protect from light. Throw away any unused medication after the expiration date. NOTE: This sheet is a summary. It may not cover all possible information. If you have questions about this medicine, talk to your doctor, pharmacist, or health care provider.  2024 Elsevier/Gold Standard (2020-11-10 00:00:00)

## 2024-03-04 DIAGNOSIS — F411 Generalized anxiety disorder: Secondary | ICD-10-CM | POA: Diagnosis not present

## 2024-03-04 DIAGNOSIS — F909 Attention-deficit hyperactivity disorder, unspecified type: Secondary | ICD-10-CM | POA: Diagnosis not present

## 2024-03-04 DIAGNOSIS — F332 Major depressive disorder, recurrent severe without psychotic features: Secondary | ICD-10-CM | POA: Diagnosis not present

## 2024-03-06 ENCOUNTER — Inpatient Hospital Stay

## 2024-03-11 DIAGNOSIS — F4310 Post-traumatic stress disorder, unspecified: Secondary | ICD-10-CM | POA: Diagnosis not present

## 2024-03-11 DIAGNOSIS — F332 Major depressive disorder, recurrent severe without psychotic features: Secondary | ICD-10-CM | POA: Diagnosis not present

## 2024-03-11 DIAGNOSIS — F909 Attention-deficit hyperactivity disorder, unspecified type: Secondary | ICD-10-CM | POA: Diagnosis not present

## 2024-04-08 ENCOUNTER — Ambulatory Visit: Admitting: Internal Medicine

## 2024-04-09 ENCOUNTER — Inpatient Hospital Stay

## 2024-04-17 ENCOUNTER — Inpatient Hospital Stay

## 2024-04-17 VITALS — BP 100/74 | HR 83 | Temp 98.1°F | Resp 18

## 2024-04-17 DIAGNOSIS — D509 Iron deficiency anemia, unspecified: Secondary | ICD-10-CM

## 2024-04-17 MED ORDER — CYANOCOBALAMIN 1000 MCG/ML IJ SOLN
1000.0000 ug | Freq: Once | INTRAMUSCULAR | Status: AC
  Administered 2024-04-17: 1000 ug via INTRAMUSCULAR
  Filled 2024-04-17: qty 1

## 2024-04-17 NOTE — Patient Instructions (Signed)
 Vitamin B12 Injection What is this medication? Vitamin B12 (VAHY tuh min B12) prevents and treats low vitamin B12 levels in your body. It is used in people who do not get enough vitamin B12 from their diet or when their digestive tract does not absorb enough. Vitamin B12 plays an important role in maintaining the health of your nervous system and red blood cells. This medicine may be used for other purposes; ask your health care provider or pharmacist if you have questions. COMMON BRAND NAME(S): B-12 Compliance Kit, B-12 Injection Kit, Cyomin, Dodex , LA-12, Nutri-Twelve, Physicians EZ Use B-12, Primabalt, Vitamin Deficiency Injectable System - B12 What should I tell my care team before I take this medication? They need to know if you have any of these conditions: Kidney disease Leber's disease Megaloblastic anemia An unusual or allergic reaction to cyanocobalamin , cobalt, other medications, foods, dyes, or preservatives Pregnant or trying to get pregnant Breast-feeding How should I use this medication? This medication is injected into a muscle or deeply under the skin. It is usually given in a clinic or care team's office. However, your care team may teach you how to inject yourself. Follow all instructions. Talk to your care team about the use of this medication in children. Special care may be needed. Overdosage: If you think you have taken too much of this medicine contact a poison control center or emergency room at once. NOTE: This medicine is only for you. Do not share this medicine with others. What if I miss a dose? If you are given your dose at a clinic or care team's office, call to reschedule your appointment. If you give your own injections, and you miss a dose, take it as soon as you can. If it is almost time for your next dose, take only that dose. Do not take double or extra doses. What may interact with this medication? Alcohol Colchicine This list may not describe all possible  interactions. Give your health care provider a list of all the medicines, herbs, non-prescription drugs, or dietary supplements you use. Also tell them if you smoke, drink alcohol, or use illegal drugs. Some items may interact with your medicine. What should I watch for while using this medication? Visit your care team regularly. You may need blood work done while you are taking this medication. You may need to follow a special diet. Talk to your care team. Limit your alcohol intake and avoid smoking to get the best benefit. What side effects may I notice from receiving this medication? Side effects that you should report to your care team as soon as possible: Allergic reactions--skin rash, itching, hives, swelling of the face, lips, tongue, or throat Swelling of the ankles, hands, or feet Trouble breathing Side effects that usually do not require medical attention (report to your care team if they continue or are bothersome): Diarrhea This list may not describe all possible side effects. Call your doctor for medical advice about side effects. You may report side effects to FDA at 1-800-FDA-1088. Where should I keep my medication? Keep out of the reach of children. Store at room temperature between 15 and 30 degrees C (59 and 85 degrees F). Protect from light. Throw away any unused medication after the expiration date. NOTE: This sheet is a summary. It may not cover all possible information. If you have questions about this medicine, talk to your doctor, pharmacist, or health care provider.  2024 Elsevier/Gold Standard (2020-11-10 00:00:00)

## 2024-04-26 ENCOUNTER — Encounter: Admitting: Family

## 2024-05-10 ENCOUNTER — Inpatient Hospital Stay: Admitting: Family

## 2024-05-10 ENCOUNTER — Inpatient Hospital Stay
